# Patient Record
Sex: Female | Born: 1975 | Race: White | Hispanic: No | Marital: Married | Smoking: Never smoker
Health system: Southern US, Community
[De-identification: ages and names within clinical notes are randomized; demographics above are authoritative.]

## PROBLEM LIST (undated history)

## (undated) DIAGNOSIS — I10 Essential (primary) hypertension: Secondary | ICD-10-CM

## (undated) HISTORY — PX: APPENDECTOMY: SHX54

---

## 2015-04-11 ENCOUNTER — Emergency Department (HOSPITAL_COMMUNITY)
Admission: EM | Admit: 2015-04-11 | Discharge: 2015-04-11 | Disposition: A | Discharge status: Home / Self Care | Payer: Self-pay | Attending: Emergency Medicine | Admitting: Emergency Medicine

## 2015-04-11 ENCOUNTER — Encounter (HOSPITAL_COMMUNITY): Payer: Self-pay | Admitting: Family Medicine

## 2015-04-11 ENCOUNTER — Emergency Department (HOSPITAL_COMMUNITY): Payer: Self-pay

## 2015-04-11 DIAGNOSIS — S93402A Sprain of unspecified ligament of left ankle, initial encounter: Secondary | ICD-10-CM | POA: Insufficient documentation

## 2015-04-11 DIAGNOSIS — W1839XA Other fall on same level, initial encounter: Secondary | ICD-10-CM | POA: Insufficient documentation

## 2015-04-11 DIAGNOSIS — Y9389 Activity, other specified: Secondary | ICD-10-CM | POA: Insufficient documentation

## 2015-04-11 DIAGNOSIS — Y92219 Unspecified school as the place of occurrence of the external cause: Secondary | ICD-10-CM | POA: Insufficient documentation

## 2015-04-11 DIAGNOSIS — S8002XA Contusion of left knee, initial encounter: Secondary | ICD-10-CM | POA: Insufficient documentation

## 2015-04-11 DIAGNOSIS — Y990 Civilian activity done for income or pay: Secondary | ICD-10-CM | POA: Insufficient documentation

## 2015-04-11 MED ORDER — IBUPROFEN 800 MG PO TABS: 800.0000 mg | ORAL_TABLET | Freq: Three times a day (TID) | ORAL | Status: DC

## 2015-04-11 NOTE — ED Notes (Signed)
Pt here for left ankle pain and knee pain after fall. Light swelling in left ankle. Bruise to left knee.

## 2015-04-11 NOTE — ED Provider Notes (Signed)
CSN: [id number]     Arrival date & time 04/11/15  1058 History  This chart was scribed for non-physician practitioner, Langston Masker, PA-C, working with Blane Ohara, MD by Charline Bills, ED Scribe. This patient was seen in room TR07C/TR07C and the patient's care was started at 12:29 PM.   Chief Complaint  Patient presents with  . Ankle Pain  . Knee Pain   The history is provided by the patient. No language interpreter was used.   HPI Comments: Mary Mathews is a 39 y.o. female who presents to the Emergency Department complaining of persistent left knee pain for the past week. Pt states that she fell at work last week, landed on her left knee and twisted her left ankle upon falling. 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 She reports associated left ankle pain, left ankle swelling and gradually improving bruising to her left knee. Pt reports constant pain to her knee and ankle that are exacerbated with bearing weight. Pt states that she has had to use her cane to ambulate since the fall. No known medical allergies.   History reviewed. No pertinent past medical history. Past Surgical History  Procedure Laterality Date  . Appendectomy    . Cesarean section     History reviewed. No pertinent family history. History  Substance Use Topics  . Smoking status: Never Smoker   . Smokeless tobacco: Not on file  . Alcohol Use: Yes     Comment: occ   OB History    No data available     Review of Systems  Musculoskeletal: Positive for joint swelling and arthralgias.  Skin: Positive for color change.   Allergies  Review of patient's allergies indicates no known allergies.  Home Medications   Prior to Admission medications   Not on File   BP 147/75 mmHg  Pulse 67  Temp(Src) 98.6 F (37 C)  Resp 20  Ht  (1.727 m)  Wt 320 lb (145.151 kg)  BMI 48.67 kg/m2  SpO2 98%  LMP 03/28/2015 Physical Exam  Constitutional: She is oriented to person, place,  and time. She appears well-developed and well-nourished. No distress.  HENT:  Head: Normocephalic and atraumatic.  Eyes: Conjunctivae and EOM are normal.  Neck: Neck supple. No tracheal deviation present.  Cardiovascular: Normal rate.   Pulmonary/Chest: Effort normal. No respiratory distress.  Musculoskeletal: Normal range of motion.  L ankle: Swollen L ankle. Limited ROM of ankle. L knee: Bruised L knee. Good ROM of knee.  Neurological: She is alert and oriented to person, place, and time. No sensory deficit.  NVI  Skin: Skin is warm and dry.  Psychiatric: She has a normal mood and affect. Her behavior is normal.  Nursing note and vitals reviewed.  ED Course  Procedures (including critical care time) DIAGNOSTIC STUDIES: Oxygen Saturation is 98% on RA, normal by my interpretation.    COORDINATION OF CARE: 12:32 PM-Discussed treatment plan which includes XRs, 800 mg ibuprofen and crutches with pt at bedside and pt agreed to plan.   Labs Review Labs Reviewed - No data to display  Imaging Review Dg Ankle Complete Left  04/11/2015   CLINICAL DATA:  Slipped with twisting injury last week. Persistent ankle pain and swelling. Initial encounter.  EXAM: LEFT ANKLE COMPLETE - 3+ VIEW  COMPARISON:  None.  FINDINGS: The mineralization and alignment are normal. There is no evidence of acute fracture or dislocation. The soft tissue surrounding the ankle are prominent without  apparent focal swelling. There are mild talonavicular degenerative changes and a small plantar calcaneal spur.  IMPRESSION: No acute osseous findings.   Electronically Signed   By: Carey Bullocks M.D.   On: 04/11/2015 11:54   Dg Knee Complete 4 Views Left  04/11/2015   CLINICAL DATA:  Slipped and fell 1 week ago when Ing on the knee. Anterior knee pain with bruising.  EXAM: LEFT KNEE - COMPLETE 4+ VIEW  COMPARISON:  None.  FINDINGS: No evidence of fracture, dislocation or joint effusion. No joint space narrowing. No other focal  lesion.  IMPRESSION: Negative.   Electronically Signed   By: Paulina Fusi M.D.   On: 04/11/2015 11:55    EKG Interpretation None      MDM   Final diagnoses:  Ankle sprain, left, initial encounter  Contusion, knee, left, initial encounter   Aso/cruthes Ibuprofen   I personally performed the services in this documentation, which was scribed in my presence.  The recorded information has been reviewed and considered.   Barnet Pall.   Lonia Skinner Onarga, PA-C 04/11/15 1638  Blane Ohara, MD 04/11/15 [phone]

## 2015-04-11 NOTE — Discharge Instructions (Signed)
Ankle Sprain  An ankle sprain is an injury to the strong, fibrous tissues (ligaments) that hold the bones of your ankle joint together.   CAUSES  An ankle sprain is usually caused by a fall or by twisting your ankle. Ankle sprains most commonly occur when you step on the outer edge of your foot, and your ankle turns inward. People who participate in sports are more prone to these types of injuries.   SYMPTOMS    Pain in your ankle. The pain may be present at rest or only when you are trying to stand or walk.   Swelling.   Bruising. Bruising may develop immediately or within 1 to 2 days after your injury.   Difficulty standing or walking, particularly when turning corners or changing directions.  DIAGNOSIS   Your caregiver will ask you details about your injury and perform a physical exam of your ankle to determine if you have an ankle sprain. During the physical exam, your caregiver will press on and apply pressure to specific areas of your foot and ankle. Your caregiver will try to move your ankle in certain ways. An X-ray exam may be done to be sure a bone was not broken or a ligament did not separate from one of the bones in your ankle (avulsion fracture).   TREATMENT   Certain types of braces can help stabilize your ankle. Your caregiver can make a recommendation for this. Your caregiver may recommend the use of medicine for pain. If your sprain is severe, your caregiver may refer you to a surgeon who helps to restore function to parts of your skeletal system (orthopedist) or a physical therapist.  HOME CARE INSTRUCTIONS    Apply ice to your injury for 1-2 days or as directed by your caregiver. Applying ice helps to reduce inflammation and pain.   Put ice in a plastic bag.   Place a towel between your skin and the bag.   Leave the ice on for 15-20 minutes at a time, every 2 hours while you are awake.   Only take over-the-counter or prescription medicines for pain, discomfort, or fever as directed by  your caregiver.   Elevate your injured ankle above the level of your heart as much as possible for 2-3 days.   If your caregiver recommends crutches, use them as instructed. Gradually put weight on the affected ankle. Continue to use crutches or a cane until you can walk without feeling pain in your ankle.   If you have a plaster splint, wear the splint as directed by your caregiver. Do not rest it on anything harder than a pillow for the first 24 hours. Do not put weight on it. Do not get it wet. You may take it off to take a shower or bath.   You may have been given an elastic bandage to wear around your ankle to provide support. If the elastic bandage is too tight (you have numbness or tingling in your foot or your foot becomes cold and blue), adjust the bandage to make it comfortable.   If you have an air splint, you may blow more air into it or let air out to make it more comfortable. You may take your splint off at night and before taking a shower or bath. Wiggle your toes in the splint several times per day to decrease swelling.  SEEK MEDICAL CARE IF:    You have rapidly increasing bruising or swelling.   Your toes feel extremely cold or   you lose feeling in your foot.   Your pain is not relieved with medicine.  SEEK IMMEDIATE MEDICAL CARE IF:   Your toes are numb or blue.   You have severe pain that is increasing.  MAKE SURE YOU:    Understand these instructions.   Will watch your condition.   Will get help right away if you are not doing well or get worse.  Document Released: 08/25/2005 Document Revised: 05/19/2012 Document Reviewed: 09/06/2011  [employer] Patient Information 2015 [employer]. This information is not intended to replace advice given to you by your health care provider. Make sure you discuss any questions you have with your health care provider.  Contusion  A contusion is a deep bruise. Contusions are the result of an injury that caused bleeding under the skin. The contusion  may turn blue, purple, or yellow. Minor injuries will give you a painless contusion, but more severe contusions may stay painful and swollen for a few weeks.   CAUSES   A contusion is usually caused by a blow, trauma, or direct force to an area of the body.  SYMPTOMS    Swelling and redness of the injured area.   Bruising of the injured area.   Tenderness and soreness of the injured area.   Pain.  DIAGNOSIS   The diagnosis can be made by taking a history and physical exam. An X-ray, CT scan, or MRI may be needed to determine if there were any associated injuries, such as fractures.  TREATMENT   Specific treatment will depend on what area of the body was injured. In general, the best treatment for a contusion is resting, icing, elevating, and applying cold compresses to the injured area. Over-the-counter medicines may also be recommended for pain control. Ask your caregiver what the best treatment is for your contusion.  HOME CARE INSTRUCTIONS    Put ice on the injured area.   Put ice in a plastic bag.   Place a towel between your skin and the bag.   Leave the ice on for 15-20 minutes, 3-4 times a day, or as directed by your health care provider.   Only take over-the-counter or prescription medicines for pain, discomfort, or fever as directed by your caregiver. Your caregiver may recommend avoiding anti-inflammatory medicines (aspirin, ibuprofen, and naproxen) for 48 hours because these medicines may increase bruising.   Rest the injured area.   If possible, elevate the injured area to reduce swelling.  SEEK IMMEDIATE MEDICAL CARE IF:    You have increased bruising or swelling.   You have pain that is getting worse.   Your swelling or pain is not relieved with medicines.  MAKE SURE YOU:    Understand these instructions.   Will watch your condition.   Will get help right away if you are not doing well or get worse.  Document Released: 06/04/2005 Document Revised: 08/30/2013 Document Reviewed:  06/30/2011  [employer] Patient Information 2015 [employer]. This information is not intended to replace advice given to you by your health care provider. Make sure you discuss any questions you have with your health care provider.

## 2019-10-13 ENCOUNTER — Encounter (HOSPITAL_COMMUNITY): Payer: Self-pay

## 2019-10-13 ENCOUNTER — Ambulatory Visit (HOSPITAL_COMMUNITY)
Admission: EM | Admit: 2019-10-13 | Discharge: 2019-10-13 | Disposition: A | Discharge status: Home / Self Care | Payer: Self-pay | Attending: Family Medicine | Admitting: Family Medicine

## 2019-10-13 ENCOUNTER — Other Ambulatory Visit: Payer: Self-pay

## 2019-10-13 DIAGNOSIS — G8929 Other chronic pain: Secondary | ICD-10-CM

## 2019-10-13 DIAGNOSIS — M79673 Pain in unspecified foot: Secondary | ICD-10-CM

## 2019-10-13 MED ORDER — MELOXICAM 7.5 MG PO TABS: 15.0000 mg | ORAL_TABLET | Freq: Every day | ORAL | 0 refills | Status: DC

## 2019-10-13 NOTE — ED Triage Notes (Signed)
Pt is here with foot pain that started in December, states its in her left ankle and both heels ever since she started back working.

## 2019-10-13 NOTE — Discharge Instructions (Signed)
Take the meloxicam daily for pain as needed You can also take additional Tylenol with this medication if needed.  Do not take any other ibuprofen or Aleve while taking the meloxicam. Follow-up with [facility] as planned

## 2019-10-14 NOTE — ED Provider Notes (Signed)
[facility]    CSN: [id number] Arrival date & time: 10/13/19  1139      History   Chief Complaint Chief Complaint  Patient presents with  . Foot Pain    HPI Mary Mathews is a 44 y.o. female.   Patient is a 44 year old female that presents today with bilateral foot pain, ankle pain.  This is been ongoing chronic thing for her for the past couple years.  Started getting worse around December.  Has had x-rays in the past and known osteoarthritis.  No new injuries.  She has been taking ibuprofen without much with the pain.  She has plans to see the tried foot specialist.  No increased swelling, redness, numbness or tingling. No open wounds, sores. Has been wearing orthopedic shoes which help from time to time. She stands long hours working at [employer] on hard floor.   ROS per HPI      History reviewed. No pertinent past medical history.  There are no problems to display for this patient.   Past Surgical History:  Procedure Laterality Date  . APPENDECTOMY    . CESAREAN SECTION      OB History   No obstetric history on file.      Home Medications    Prior to Admission medications   Medication Sig Start Date End Date Taking? Authorizing Provider  ibuprofen (ADVIL,MOTRIN) 800 MG tablet Take 1 tablet (800 mg total) by mouth 3 (three) times daily. 04/11/15   Elson Areas, PA-C  meloxicam (MOBIC) 7.5 MG tablet Take 2 tablets (15 mg total) by mouth daily. 10/13/19   Janace Aris, NP    Family History Family History  Problem Relation Age of Onset  . Breast cancer Mother   . Hypertension Mother   . Congestive Heart Failure Father   . Hypertension Father     Social History Social History   Tobacco Use  . Smoking status: Never Smoker  . Smokeless tobacco: Never Used  Substance Use Topics  . Alcohol use: Yes    Comment: occ  . Drug use: No     Allergies   Patient has no known allergies.   Review of Systems Review of Systems   Physical  Exam Triage Vital Signs ED Triage Vitals  Enc Vitals Group     BP 10/13/19 1214 (!) 152/80     Pulse Rate 10/13/19 1214 69     Resp 10/13/19 1214 (!) 22     Temp 10/13/19 1214 98.3 F (36.8 C)     Temp Source 10/13/19 1214 Oral     SpO2 10/13/19 1214 100 %     Weight 10/13/19 1212 (!) 332 lb (150.6 kg)     Height --      Head Circumference --      Peak Flow --      Pain Score 10/13/19 1211 8     Pain Loc --      Pain Edu? --      Excl. in GC? --    No data found.  Updated Vital Signs BP (!) 152/80 (BP Location: Left Arm)   Pulse 69   Temp 98.3 F (36.8 C) (Oral)   Resp (!) 22   Wt (!) 332 lb (150.6 kg)   LMP 09/19/2019   SpO2 100%   BMI 50.48 kg/m   Visual Acuity Right Eye Distance:   Left Eye Distance:   Bilateral Distance:    Right Eye Near:   Left Eye  Near:    Bilateral Near:     Physical Exam Vitals and nursing note reviewed.  Constitutional:      General: She is not in acute distress.    Appearance: Normal appearance. She is not ill-appearing, toxic-appearing or diaphoretic.  HENT:     Head: Normocephalic.     Nose: Nose normal.  Eyes:     Conjunctiva/sclera: Conjunctivae normal.  Pulmonary:     Effort: Pulmonary effort is normal.  Musculoskeletal:        General: Normal range of motion.     Cervical back: Normal range of motion.  Skin:    General: Skin is warm and dry.     Findings: No rash.  Neurological:     Mental Status: She is alert.  Psychiatric:        Mood and Affect: Mood normal.      UC Treatments / Results  Labs (all labs ordered are listed, but only abnormal results are displayed) Labs Reviewed - No data to display  EKG   Radiology No results found.  Procedures Procedures (including critical care time)  Medications Ordered in UC Medications - No data to display  Initial Impression / Assessment and Plan / UC Course  I have reviewed the triage vital signs and the nursing notes.  Pertinent labs & imaging results  that were available during my care of the patient were reviewed by me and considered in my medical decision making (see chart for details).     Chronic foot pain- pt here today requesting note for work so she can take a 30 minute break. She plans to see a specialist soon.  Given meloxicam for pain to take daily.  Tylenol for additional pain relief.  Rest, Ice orthopedic shoes.   Final Clinical Impressions(s) / UC Diagnoses   Final diagnoses:  Chronic foot pain, unspecified laterality     Discharge Instructions     Take the meloxicam daily for pain as needed You can also take additional Tylenol with this medication if needed.  Do not take any other ibuprofen or Aleve while taking the meloxicam. Follow-up with [facility] as planned    ED Prescriptions    Medication Sig Dispense Auth. Provider   meloxicam (MOBIC) 7.5 MG tablet Take 2 tablets (15 mg total) by mouth daily. 30 tablet Loura Halt A, NP     PDMP not reviewed this encounter.   Loura Halt A, NP 10/14/19 1204

## 2019-11-02 ENCOUNTER — Ambulatory Visit (HOSPITAL_COMMUNITY)
Admission: EM | Admit: 2019-11-02 | Discharge: 2019-11-02 | Disposition: A | Discharge status: Home / Self Care | Payer: Self-pay | Attending: Family Medicine | Admitting: Family Medicine

## 2019-11-02 ENCOUNTER — Other Ambulatory Visit: Payer: Self-pay

## 2019-11-02 ENCOUNTER — Encounter (HOSPITAL_COMMUNITY): Payer: Self-pay

## 2019-11-02 DIAGNOSIS — M79672 Pain in left foot: Secondary | ICD-10-CM

## 2019-11-02 DIAGNOSIS — M25572 Pain in left ankle and joints of left foot: Secondary | ICD-10-CM

## 2019-11-02 DIAGNOSIS — M25571 Pain in right ankle and joints of right foot: Secondary | ICD-10-CM

## 2019-11-02 DIAGNOSIS — M79671 Pain in right foot: Secondary | ICD-10-CM

## 2019-11-02 DIAGNOSIS — I10 Essential (primary) hypertension: Secondary | ICD-10-CM

## 2019-11-02 MED ORDER — MELOXICAM 7.5 MG PO TABS: 7.5000 mg | ORAL_TABLET | Freq: Every day | ORAL | 0 refills | Status: DC

## 2019-11-02 MED ORDER — AMLODIPINE BESYLATE 5 MG PO TABS: 5.0000 mg | ORAL_TABLET | Freq: Every day | ORAL | 0 refills | Status: DC

## 2019-11-02 NOTE — ED Provider Notes (Signed)
[facility]    CSN: [id number] Arrival date & time: 11/02/19  7619      History   Chief Complaint Chief Complaint  Patient presents with  . Ankle Pain  . Foot Pain    HPI Mary Mathews is a 44 y.o. female no significant past medical history presenting today for evaluation of bilateral foot and ankle pain and medication refill.  Patient states that she has chronic pain in bilateral heels and ankle areas.  She has known arthritis from prior x-rays.  Pain has been going on for years.  Recently she has been treated with meloxicam which does provide temporary relief for approximately 3 to 4 hours.  She has previously seen foot specialist, not done recently.  Does not have primary care.  Wears orthopedic shoes and compression stockings.  Pain similar to typical symptoms.  Denies erythema/fevers.  She also is requesting treatment of blood pressure.  Previously was on blood pressure medicine, has not been up currently.  Cannot recall prior medication.  Notes that blood pressure typically elevated recently.  Denies chest pain or headaches.  HPI  History reviewed. No pertinent past medical history.  There are no problems to display for this patient.   Past Surgical History:  Procedure Laterality Date  . APPENDECTOMY    . CESAREAN SECTION      OB History   No obstetric history on file.      Home Medications    Prior to Admission medications   Medication Sig Start Date End Date Taking? Authorizing Provider  amLODipine (NORVASC) 5 MG tablet Take 1 tablet (5 mg total) by mouth daily. 11/02/19   Lester Crickenberger C, PA-C  meloxicam (MOBIC) 7.5 MG tablet Take 1 tablet (7.5 mg total) by mouth daily. Take in the morning, with food. 11/02/19   Kamica Florance, Junius Creamer, PA-C    Family History Family History  Problem Relation Age of Onset  . Breast cancer Mother   . Hypertension Mother   . Congestive Heart Failure Father   . Hypertension Father     Social History Social  History   Tobacco Use  . Smoking status: Never Smoker  . Smokeless tobacco: Never Used  Substance Use Topics  . Alcohol use: Yes    Comment: occ  . Drug use: No     Allergies   Patient has no known allergies.   Review of Systems Review of Systems  Constitutional: Negative for fatigue and fever.  Eyes: Negative for visual disturbance.  Respiratory: Negative for shortness of breath.   Cardiovascular: Negative for chest pain.  Gastrointestinal: Negative for abdominal pain, nausea and vomiting.  Musculoskeletal: Positive for arthralgias and gait problem. Negative for joint swelling.  Skin: Negative for color change, rash and wound.  Neurological: Negative for dizziness, weakness, light-headedness and headaches.     Physical Exam Triage Vital Signs ED Triage Vitals  Enc Vitals Group     BP 11/02/19 0857 (!) 154/91     Pulse Rate 11/02/19 0857 67     Resp 11/02/19 0857 20     Temp 11/02/19 0857 98.9 F (37.2 C)     Temp Source 11/02/19 0857 Oral     SpO2 11/02/19 0857 97 %     Weight 11/02/19 0853 (!) 326 lb 9.6 oz (148.1 kg)     Height --      Head Circumference --      Peak Flow --      Pain Score 11/02/19 0853 6  Pain Loc --      Pain Edu? --      Excl. in GC? --    No data found.  Updated Vital Signs BP (!) 154/91 (BP Location: Left Arm)   Pulse 67   Temp 98.9 F (37.2 C) (Oral)   Resp 20   Wt (!) 326 lb 9.6 oz (148.1 kg)   LMP 10/21/2019   SpO2 97%   BMI 49.66 kg/m   Visual Acuity Right Eye Distance:   Left Eye Distance:   Bilateral Distance:    Right Eye Near:   Left Eye Near:    Bilateral Near:     Physical Exam Vitals and nursing note reviewed.  Constitutional:      Appearance: She is well-developed.     Comments: No acute distress  HENT:     Head: Normocephalic and atraumatic.     Nose: Nose normal.  Eyes:     Conjunctiva/sclera: Conjunctivae normal.  Cardiovascular:     Rate and Rhythm: Normal rate.  Pulmonary:     Effort:  Pulmonary effort is normal. No respiratory distress.  Abdominal:     General: There is no distension.  Musculoskeletal:        General: Normal range of motion.     Cervical back: Neck supple.     Comments: Bilateral ankles/feet: No obvious swelling erythema or deformity, no discoloration, bilateral dorsalis pedis 2+, mild tenderness to palpation to anterior ankles bilaterally and over plantar heel areas  Skin:    General: Skin is warm and dry.  Neurological:     Mental Status: She is alert and oriented to person, place, and time.      UC Treatments / Results  Labs (all labs ordered are listed, but only abnormal results are displayed) Labs Reviewed - No data to display  EKG   Radiology No results found.  Procedures Procedures (including critical care time)  Medications Ordered in UC Medications - No data to display  Initial Impression / Assessment and Plan / UC Course  I have reviewed the triage vital signs and the nursing notes.  Pertinent labs & imaging results that were available during my care of the patient were reviewed by me and considered in my medical decision making (see chart for details).    1.  Bilateral ankle and heel pain, chronic Refilling Mobic, no new complaints with feet/ankles.  No obvious deformity, no sign of cellulitis or infection.  2.  Hypertension Initiating on amlodipine 5 mg daily, blood pressure has been consistently elevated over past visits.  Will have follow-up and establish care with primary care for further management and treatment.  Discussed strict return precautions. Patient verbalized understanding and is agreeable with plan.  Final Clinical Impressions(s) / UC Diagnoses   Final diagnoses:  Bilateral foot pain  Acute bilateral ankle pain  Essential hypertension     Discharge Instructions     I refilled your Mobic to help with your foot and ankle pain, take with food  Please begin amlodipine 5 mg daily to treat blood  pressure Please follow-up with primary care for further blood pressure treatment, refills and management Please monitor blood pressure at home if you are able   ED Prescriptions    Medication Sig Dispense Auth. Provider   meloxicam (MOBIC) 7.5 MG tablet Take 1 tablet (7.5 mg total) by mouth daily. Take in the morning, with food. 30 tablet Reynalda Canny C, PA-C   amLODipine (NORVASC) 5 MG tablet Take 1 tablet (5  mg total) by mouth daily. 30 tablet Watson Robarge, Mansfield C, PA-C     PDMP not reviewed this encounter.   Janith Lima, PA-C 11/02/19 1009

## 2019-11-02 NOTE — ED Triage Notes (Addendum)
Pt is here for a follow up for her last visit on 10/13/2019 & states her pain in both heels have went down a lot. States she has no PCP.

## 2019-11-02 NOTE — Discharge Instructions (Signed)
I refilled your Mobic to help with your foot and ankle pain, take with food  Please begin amlodipine 5 mg daily to treat blood pressure Please follow-up with primary care for further blood pressure treatment, refills and management Please monitor blood pressure at home if you are able

## 2019-11-09 ENCOUNTER — Emergency Department (HOSPITAL_COMMUNITY): Payer: Self-pay

## 2019-11-09 ENCOUNTER — Encounter (HOSPITAL_COMMUNITY): Payer: Self-pay

## 2019-11-09 ENCOUNTER — Other Ambulatory Visit: Payer: Self-pay

## 2019-11-09 ENCOUNTER — Emergency Department (HOSPITAL_COMMUNITY)
Admission: EM | Admit: 2019-11-09 | Discharge: 2019-11-10 | Disposition: A | Discharge status: Home / Self Care | Payer: Self-pay | Attending: Emergency Medicine | Admitting: Emergency Medicine

## 2019-11-09 DIAGNOSIS — I10 Essential (primary) hypertension: Secondary | ICD-10-CM | POA: Insufficient documentation

## 2019-11-09 DIAGNOSIS — Z79899 Other long term (current) drug therapy: Secondary | ICD-10-CM | POA: Insufficient documentation

## 2019-11-09 DIAGNOSIS — R072 Precordial pain: Secondary | ICD-10-CM | POA: Insufficient documentation

## 2019-11-09 LAB — CBC
HCT: 38.5 % (ref 36.0–46.0)
Hemoglobin: 12.3 g/dL (ref 12.0–15.0)
MCH: 28.3 pg (ref 26.0–34.0)
MCHC: 31.9 g/dL (ref 30.0–36.0)
MCV: 88.5 fL (ref 80.0–100.0)
Platelets: 232 10*3/uL (ref 150–400)
RBC: 4.35 MIL/uL (ref 3.87–5.11)
RDW: 14 % (ref 11.5–15.5)
WBC: 8.4 10*3/uL (ref 4.0–10.5)
nRBC: 0 % (ref 0.0–0.2)

## 2019-11-09 LAB — I-STAT BETA HCG BLOOD, ED (MC, WL, AP ONLY): I-stat hCG, quantitative: <5 m[IU]/mL (ref <5)

## 2019-11-09 LAB — BASIC METABOLIC PANEL
Anion gap: 11 (ref 5–15)
BUN: 11 mg/dL (ref 6–20)
CO2: 22 mmol/L (ref 22–32)
Calcium: 8.8 mg/dL — ABNORMAL LOW (ref 8.9–10.3)
Chloride: 106 mmol/L (ref 98–111)
Creatinine, Ser: 0.75 mg/dL (ref 0.44–1.00)
GFR calc Af Amer: >60 mL/min (ref >60)
GFR calc non Af Amer: >60 mL/min (ref >60)
Glucose, Bld: 98 mg/dL (ref 70–99)
Potassium: 3.9 mmol/L (ref 3.5–5.1)
Sodium: 139 mmol/L (ref 135–145)

## 2019-11-09 LAB — TROPONIN I (HIGH SENSITIVITY)
Troponin I (High Sensitivity): 4 ng/L (ref <18)
Troponin I (High Sensitivity): 3 ng/L (ref <18)

## 2019-11-09 NOTE — ED Triage Notes (Signed)
Pt reports chest pain since Monday that has worsened over the past few days, denies any other symptoms, pain is worse when she bends over. Pt a.o, arrives with walker.

## 2019-11-10 ENCOUNTER — Telehealth: Payer: Self-pay | Admitting: *Deleted

## 2019-11-10 MED ORDER — ACETAMINOPHEN 325 MG PO TABS
650.0000 mg | ORAL_TABLET | Freq: Once | ORAL | Status: AC
Start: 2019-11-10 — End: 2019-11-10
  Administered 2019-11-10: 650 mg via ORAL
  Filled 2019-11-10: qty 2

## 2019-11-10 NOTE — Discharge Instructions (Addendum)

## 2019-11-10 NOTE — ED Provider Notes (Signed)
[facility] EMERGENCY DEPARTMENT Provider Note   CSN: [id number] Arrival date & time: 11/09/19  1718     History Chief Complaint  Patient presents with  . Chest Pain    Gayleen Sholtz is a 44 y.o. female.   Chest Pain Associated symptoms: no abdominal pain, no fever, no nausea, no shortness of breath and no vomiting     HPI: A 44 year old patient with a history of obesity presents for evaluation of chest pain. Initial onset of pain was more than 6 hours ago. The patient's chest pain is not worse with exertion. The patient's chest pain is not middle- or left-sided, is not well-localized, is not described as heaviness/pressure/tightness, is not sharp and does not radiate to the arms/jaw/neck. The patient does not complain of nausea and denies diaphoresis. The patient has a family history of coronary artery disease in a first-degree relative with onset less than age 55. The patient has no history of stroke, has no history of peripheral artery disease, has not smoked in the past 90 days, denies any history of treated diabetes, is not hypertensive and has no history of hypercholesterolemia.  Patient reports right-sided chest pain for up to 2 days.  She reports it is only worse with bending over and palpation. She denies pleuritic pain She denies any other symptoms.  She denies fever/vomiting/shortness of breath. She denies any known history of CAD/VTE She uses a walker chronically due to chronic bilateral foot pain. No other acute complaints She recently restarted blood pressure medications.  PMH-HTN, plantar fascitis Past Surgical History:  Procedure Laterality Date  . APPENDECTOMY    . CESAREAN SECTION       OB History   No obstetric history on file.     Family History  Problem Relation Age of Onset  . Breast cancer Mother   . Hypertension Mother   . Congestive Heart Failure Father   . Hypertension Father     Social History   Tobacco Use  .  Smoking status: Never Smoker  . Smokeless tobacco: Never Used  Substance Use Topics  . Alcohol use: Yes    Comment: occ  . Drug use: No    Home Medications Prior to Admission medications   Medication Sig Start Date End Date Taking? Authorizing Provider  amLODipine (NORVASC) 5 MG tablet Take 1 tablet (5 mg total) by mouth daily. 11/02/19   Wieters, Hallie C, PA-C  meloxicam (MOBIC) 7.5 MG tablet Take 1 tablet (7.5 mg total) by mouth daily. Take in the morning, with food. 11/02/19   Wieters, Hallie C, PA-C    Allergies    Patient has no known allergies.  Review of Systems   Review of Systems  Constitutional: Negative for fever.  Respiratory: Negative for shortness of breath.   Cardiovascular: Positive for chest pain.       Chronic leg swelling  Gastrointestinal: Negative for abdominal pain, nausea and vomiting.  Musculoskeletal: Positive for arthralgias.       Chronic foot pain  All other systems reviewed and are negative.   Physical Exam Updated Vital Signs BP (!) 164/84 (BP Location: Right Arm)   Pulse 70   Temp 98.2 F (36.8 C) (Oral)   Resp (!) 24   LMP 10/21/2019   SpO2 99%   Physical Exam CONSTITUTIONAL: Well developed/well nourished HEAD: Normocephalic/atraumatic EYES: EOMI ENMT: Mucous membranes moist NECK: supple no meningeal signs SPINE/BACK:entire spine nontender CV: S1/S2 noted, no murmurs/rubs/gallops noted LUNGS: Lungs are clear to auscultation bilaterally, no  apparent distress Chest-mild right-sided tenderness, no crepitus ABDOMEN: soft, nontender, no rebound or guarding, bowel sounds noted throughout abdomen GU:no cva tenderness NEURO: Pt is awake/alert/appropriate, moves all extremitiesx4.  No facial droop.   EXTREMITIES: pulses normal/equal, full ROM SKIN: warm, color normal PSYCH: no abnormalities of mood noted, alert and oriented to situation  ED Results / Procedures / Treatments   Labs (all labs ordered are listed, but only abnormal results  are displayed) Labs Reviewed  BASIC METABOLIC PANEL - Abnormal; Notable for the following components:      Result Value   Calcium 8.8 (*)    All other components within normal limits  CBC  I-STAT BETA HCG BLOOD, ED ([facility], [facility], [facility] ONLY)  TROPONIN I (HIGH SENSITIVITY)  TROPONIN I (HIGH SENSITIVITY)    EKG EKG Interpretation  Date/Time:  Wednesday November 09 2019 17:19:40 EST Ventricular Rate:  85 PR Interval:  146 QRS Duration: 76 QT Interval:  364 QTC Calculation: 433 R Axis:   3 Text Interpretation: Normal sinus rhythm Anterior infarct , age undetermined Abnormal ECG No previous ECGs available Interpretation limited secondary to artifact Confirmed by Ripley Fraise [id number]) on 11/10/2019 2:22:17 AM   Radiology DG Chest 2 View  Result Date: 11/09/2019 CLINICAL DATA:  Chest pain. EXAM: CHEST - 2 VIEW COMPARISON:  None. FINDINGS: The heart size and mediastinal contours are within normal limits. Both lungs are clear. The visualized skeletal structures are unremarkable. IMPRESSION: No active cardiopulmonary disease. Electronically Signed   By: Constance Holster M.D.   On: 11/09/2019 17:55    Procedures Procedures  Medications Ordered in ED Medications  acetaminophen (TYLENOL) tablet 650 mg (has no administration in time range)    ED Course  I have reviewed the triage vital signs and the nursing notes.  Pertinent labs & imaging results that were available during my care of the patient were reviewed by me and considered in my medical decision making (see chart for details).    MDM Rules/Calculators/A&P HEAR Score: 2                    Patient presents for right-sided chest pain.  It is worse with bending over and palpation Low suspicion for ACS/PE.  She had 1 documented episode with a pulse ox 94% @ 2305 but readings since that time have been normal  This all appears to be musculoskeletal in origin.  Patient does not have a local primary care, and it is her third ER/urgent care  visit in 1 month Referral has been given to [facility] health and wellness for blood pressure management.  Final Clinical Impression(s) / ED Diagnoses Final diagnoses:  Precordial pain    Rx / DC Orders ED Discharge Orders    None       Ripley Fraise, MD 11/10/19 [phone]

## 2019-11-10 NOTE — ED Notes (Signed)
Patient ambulated to room from waiting room without difficulty.  Used home walker.  NAD

## 2019-11-10 NOTE — Telephone Encounter (Signed)
RNCM attempted to call pt to inform her of upcoming appointment 3/22 @2 :30 at [facility].  Will reach out to Urgent Care to have them inform her should she return there before 3/22.

## 2019-11-28 ENCOUNTER — Other Ambulatory Visit: Payer: Self-pay

## 2019-11-28 ENCOUNTER — Ambulatory Visit: Payer: Self-pay | Attending: Family Medicine | Admitting: Family Medicine

## 2019-11-28 ENCOUNTER — Encounter: Payer: Self-pay | Admitting: Family Medicine

## 2019-11-28 VITALS — Ht 68.5 in

## 2019-11-28 DIAGNOSIS — M79672 Pain in left foot: Secondary | ICD-10-CM

## 2019-11-28 DIAGNOSIS — G8929 Other chronic pain: Secondary | ICD-10-CM

## 2019-11-28 DIAGNOSIS — I10 Essential (primary) hypertension: Secondary | ICD-10-CM

## 2019-11-28 DIAGNOSIS — M79671 Pain in right foot: Secondary | ICD-10-CM

## 2019-11-28 MED ORDER — AMLODIPINE BESYLATE 5 MG PO TABS: 5.0000 mg | ORAL_TABLET | Freq: Every day | ORAL | 3 refills | Status: DC

## 2019-11-28 MED ORDER — MELOXICAM 7.5 MG PO TABS: 7.5000 mg | ORAL_TABLET | Freq: Every day | ORAL | 3 refills | Status: AC

## 2019-11-28 NOTE — Progress Notes (Signed)
Virtual Visit via Telephone Note  I connected with Mary Mathews, on 11/28/2019 at 3:00 PM by telephone due to the COVID-19 pandemic and verified that I am speaking with the correct person using two identifiers.   Consent: I discussed the limitations, risks, security and privacy concerns of performing an evaluation and management service by telephone and the availability of in person appointments. I also discussed with the patient that there may be a patient responsible charge related to this service. The patient expressed understanding and agreed to proceed.   Location of Patient: Home  Location of Provider: Clinic   Persons participating in Telemedicine visit: Draya Felker Farrington-CMA Dr. Alvis Lemmings     History of Present Illness: Mary Mathews is a 44 year old female with a history of hypertension who presents today to establish care. She was seen 3 weeks ago at the ED for chest pain and ruled out for ACS, EKG, chest x-ray and labs were normal.  Pain was thought to be musculoskeletal in origin. She is doing a whole lot better and chest pain has resolved She takes Meloxicam as needed for feet pain and ankles; L ankle is worse.  She attributes feet pain to having flatfeet and spurs.  No past medical history on file. No Known Allergies  Current Outpatient Medications on File Prior to Visit  Medication Sig Dispense Refill  . acetaminophen (TYLENOL) 325 MG tablet Take 650 mg by mouth every 6 (six) hours as needed for mild pain or fever.    Marland Kitchen amLODipine (NORVASC) 5 MG tablet Take 1 tablet (5 mg total) by mouth daily. 30 tablet 0  . meloxicam (MOBIC) 7.5 MG tablet Take 1 tablet (7.5 mg total) by mouth daily. Take in the morning, with food. 30 tablet 0   No current facility-administered medications on file prior to visit.    Observations/Objective: Awake, alert, oriented x3 Not in acute distress  Assessment and Plan: 1. Essential hypertension Uncontrolled  from last ED visit with blood pressure 164/84 We will recheck blood pressure next in-person visit Continue amlodipine Counseled on blood pressure goal of less than 130/80, low-sodium, DASH diet, medication compliance, 150 minutes of moderate intensity exercise per week. Discussed medication compliance, adverse effects. - amLODipine (NORVASC) 5 MG tablet; Take 1 tablet (5 mg total) by mouth daily.  Dispense: 30 tablet; Refill: 3  2. Chronic pain of both feet Stable - meloxicam (MOBIC) 7.5 MG tablet; Take 1 tablet (7.5 mg total) by mouth daily. Take in the morning, with food.  Dispense: 30 tablet; Refill: 3   Follow Up Instructions: Return in about 1 month (around 12/29/2019) for Annual physical exam and fasting labs.    I discussed the assessment and treatment plan with the patient. The patient was provided an opportunity to ask questions and all were answered. The patient agreed with the plan and demonstrated an understanding of the instructions.   The patient was advised to call back or seek an in-person evaluation if the symptoms worsen or if the condition fails to improve as anticipated.     I provided 11 minutes total of non-face-to-face time during this encounter including median intraservice time, reviewing previous notes, investigations, ordering medications, medical decision making, coordinating care and patient verbalized understanding at the end of the visit.     Hoy Register, MD, FAAFP. [facility] [place], [place] [phone]   11/28/2019, 3:00 PM

## 2019-12-14 ENCOUNTER — Encounter (HOSPITAL_COMMUNITY): Payer: Self-pay

## 2019-12-14 ENCOUNTER — Ambulatory Visit (HOSPITAL_COMMUNITY)
Admission: EM | Admit: 2019-12-14 | Discharge: 2019-12-14 | Disposition: A | Discharge status: Home / Self Care | Payer: Self-pay

## 2019-12-14 ENCOUNTER — Other Ambulatory Visit: Payer: Self-pay

## 2019-12-14 ENCOUNTER — Ambulatory Visit (INDEPENDENT_AMBULATORY_CARE_PROVIDER_SITE_OTHER): Payer: Self-pay

## 2019-12-14 DIAGNOSIS — X58XXXA Exposure to other specified factors, initial encounter: Secondary | ICD-10-CM

## 2019-12-14 DIAGNOSIS — M7989 Other specified soft tissue disorders: Secondary | ICD-10-CM

## 2019-12-14 DIAGNOSIS — S61207A Unspecified open wound of left little finger without damage to nail, initial encounter: Secondary | ICD-10-CM

## 2019-12-14 DIAGNOSIS — M79645 Pain in left finger(s): Secondary | ICD-10-CM

## 2019-12-14 HISTORY — DX: Essential (primary) hypertension: I10

## 2019-12-14 MED ORDER — CEFTRIAXONE SODIUM 1 G IJ SOLR
INTRAMUSCULAR | Status: AC
  Filled 2019-12-14: qty 10

## 2019-12-14 MED ORDER — CEFTRIAXONE SODIUM 1 G IJ SOLR
1.0000 g | Freq: Once | INTRAMUSCULAR | Status: AC
  Administered 2019-12-14: 1 g via INTRAMUSCULAR

## 2019-12-14 MED ORDER — KETOROLAC TROMETHAMINE 30 MG/ML IJ SOLN
INTRAMUSCULAR | Status: AC
  Filled 2019-12-14: qty 1

## 2019-12-14 MED ORDER — LIDOCAINE HCL (PF) 1 % IJ SOLN
INTRAMUSCULAR | Status: AC
  Filled 2019-12-14: qty 2

## 2019-12-14 MED ORDER — TRAMADOL HCL 50 MG PO TABS
50.0000 mg | ORAL_TABLET | Freq: Four times a day (QID) | ORAL | 0 refills | Status: AC | PRN
Start: 2019-12-14 — End: ?

## 2019-12-14 MED ORDER — AMOXICILLIN-POT CLAVULANATE 875-125 MG PO TABS: 1.0000 | ORAL_TABLET | Freq: Two times a day (BID) | ORAL | 0 refills | Status: AC

## 2019-12-14 MED ORDER — KETOROLAC TROMETHAMINE 30 MG/ML IJ SOLN
30.0000 mg | Freq: Once | INTRAMUSCULAR | Status: AC
  Administered 2019-12-14: 13:00:00 30 mg via INTRAVENOUS

## 2019-12-14 NOTE — ED Provider Notes (Signed)
[facility]    CSN: [id number] Arrival date & time: 12/14/19  1054      History   Chief Complaint Chief Complaint  Patient presents with  . abcess    HPI Mary Mathews is a 44 y.o. female.   Patient is a 44 year old female with past medical history of hypertension.  She is here today for left hand, swelling, erythema and drainage.  Reporting she hit her hand on something at work last week and it left a small bump.  Since the bump has grown and is started draining.  Bloody, purulent drainage.  Very limited range of motion of the hand and digits due to the swelling.  2+ edema to the fingers and hand is warm to touch.  Good strong radial pulse.  No history of diabetes.  No fever, chills, body aches or night sweats.  She has been taking Tylenol for the pain with no relief.  ROS per HPI      Past Medical History:  Diagnosis Date  . Hypertension     There are no problems to display for this patient.   Past Surgical History:  Procedure Laterality Date  . APPENDECTOMY    . CESAREAN SECTION      OB History   No obstetric history on file.      Home Medications    Prior to Admission medications   Medication Sig Start Date End Date Taking? Authorizing Provider  amLODipine (NORVASC) 5 MG tablet Take 5 mg by mouth daily.   Yes [provider]  acetaminophen (TYLENOL) 325 MG tablet Take 650 mg by mouth every 6 (six) hours as needed for mild pain or fever.    [provider]  amLODipine (NORVASC) 5 MG tablet Take 1 tablet (5 mg total) by mouth daily. 11/28/19   Hoy Register, MD  amoxicillin-clavulanate (AUGMENTIN) 875-125 MG tablet Take 1 tablet by mouth every 12 (twelve) hours. 12/14/19   Dahlia Byes A, NP  meloxicam (MOBIC) 7.5 MG tablet Take 1 tablet (7.5 mg total) by mouth daily. Take in the morning, with food. 11/28/19   Hoy Register, MD  traMADol (ULTRAM) 50 MG tablet Take 1 tablet (50 mg total) by mouth every 6 (six) hours as needed.  12/14/19   Janace Aris, NP    Family History Family History  Problem Relation Age of Onset  . Breast cancer Mother   . Hypertension Mother   . Congestive Heart Failure Father   . Hypertension Father     Social History Social History   Tobacco Use  . Smoking status: Never Smoker  . Smokeless tobacco: Never Used  Substance Use Topics  . Alcohol use: Yes    Comment: occ  . Drug use: No     Allergies   Patient has no known allergies.   Review of Systems Review of Systems   Physical Exam Triage Vital Signs ED Triage Vitals [12/14/19 1142]  Enc Vitals Group     BP (!) 169/82     Pulse Rate 65     Resp 18     Temp 98.1 F (36.7 C)     Temp Source Oral     SpO2 99 %     Weight (!) 322 lb (146.1 kg)     Height 5\' 8"  (1.727 m)     Head Circumference      Peak Flow      Pain Score 10     Pain Loc  Pain Edu?      Excl. in GC?    No data found.  Updated Vital Signs BP (!) 169/82   Pulse 65   Temp 98.1 F (36.7 C) (Oral)   Resp 18   Ht 5\' 8"  (1.727 m)   Wt (!) 322 lb (146.1 kg)   SpO2 99%   BMI 48.96 kg/m   Visual Acuity Right Eye Distance:   Left Eye Distance:   Bilateral Distance:    Right Eye Near:   Left Eye Near:    Bilateral Near:     Physical Exam Vitals and nursing note reviewed.  Constitutional:      General: She is not in acute distress.    Appearance: Normal appearance. She is not ill-appearing, toxic-appearing or diaphoretic.  HENT:     Head: Normocephalic.     Nose: Nose normal.  Eyes:     Conjunctiva/sclera: Conjunctivae normal.  Pulmonary:     Effort: Pulmonary effort is normal.  Musculoskeletal:        General: Normal range of motion.     Cervical back: Normal range of motion.     Comments: See picture for detail   Skin:    General: Skin is warm and dry.     Findings: No rash.  Neurological:     Mental Status: She is alert.  Psychiatric:        Mood and Affect: Mood normal.        UC Treatments / Results    Labs (all labs ordered are listed, but only abnormal results are displayed) Labs Reviewed - No data to display  EKG   Radiology DG Hand Complete Left  Result Date: 12/14/2019 CLINICAL DATA:  Left hand pain, swelling and erythema since injury at work last week. Open draining wound over the 5th digit. EXAM: LEFT HAND - COMPLETE 3+ VIEW COMPARISON:  None. FINDINGS: The mineralization and alignment are normal. There is no evidence of acute fracture or dislocation. The joint spaces are preserved. No evidence of bone destruction. There is diffuse soft tissue swelling throughout the hand, especially dorsally. There is focal swelling over the dorsal aspect of the 5th MCP joint. No foreign body or definite soft tissue emphysema. IMPRESSION: 1. Diffuse soft tissue swelling, especially adjacent to the 5th MCP joint. 2. No radiopaque foreign body or evidence of osteomyelitis. Electronically Signed   By: 02/13/2020 M.D.   On: 12/14/2019 12:16    Procedures Procedures (including critical care time)  Medications Ordered in UC Medications  cefTRIAXone (ROCEPHIN) injection 1 g (1 g Intramuscular Given 12/14/19 1254)  ketorolac (TORADOL) 30 MG/ML injection 30 mg (30 mg Intravenous Given 12/14/19 1259)    Initial Impression / Assessment and Plan / UC Course  I have reviewed the triage vital signs and the nursing notes.  Pertinent labs & imaging results that were available during my care of the patient were reviewed by me and considered in my medical decision making (see chart for details).     Left hand pain, swelling most likely due to some sort of infection.  Abscess to dorsum of hand near fifth digit Very limited range of motion of hand due to swelling and pain. Will get x-ray to rule out osteomyelitis and underlying fracture.  X ray negative  Abscess to dorsum of hand made very small opening to better drain the abscess.  Purulent bloody drainage.  Wrapped hand 1 g rocephin given here and  Toradol for pain Sending home with Augmentin and  tramadol for pain.  Will have pt follow up in 2 days for recheck.  Return sooner if worse.     Final Clinical Impressions(s) / UC Diagnoses   Final diagnoses:  Swelling of left hand     Discharge Instructions     Treating you for hand infection Antibiotic injection given here for infection and Toradol given here for pain Sending more antibiotics to the pharmacy to start taking today. Tramadol for severe pain. I would like for you to follow-up and 2 days for recheck to ensure the infection is improving.  Please follow-up sooner if worsening      ED Prescriptions    Medication Sig Dispense Auth. Provider   amoxicillin-clavulanate (AUGMENTIN) 875-125 MG tablet Take 1 tablet by mouth every 12 (twelve) hours. 14 tablet Aislinn Feliz A, NP   traMADol (ULTRAM) 50 MG tablet Take 1 tablet (50 mg total) by mouth every 6 (six) hours as needed. 15 tablet Azariah Bonura A, NP     I have reviewed the PDMP during this encounter.   Loura Halt A, NP 12/15/19 [phone]

## 2019-12-14 NOTE — Discharge Instructions (Addendum)
Treating you for hand infection Antibiotic injection given here for infection and Toradol given here for pain Sending more antibiotics to the pharmacy to start taking today. Tramadol for severe pain. I would like for you to follow-up and 2 days for recheck to ensure the infection is improving.  Please follow-up sooner if worsening

## 2019-12-14 NOTE — ED Triage Notes (Signed)
Pt states she hit her left hand on something at the job last week and it left a small bump, but the bump has grown over the past wk. Pt has a abcess on top of the 5th knuckle of left hand, it's ecchymotic and has bloody drainage. Left hand is erythematous and hand and fingers are 2+ edema. Hand is hot to touch. 2+ left radial pulse, cap refill less than 3 sec, pt able to wiggle finger.

## 2019-12-16 ENCOUNTER — Other Ambulatory Visit: Payer: Self-pay

## 2019-12-16 ENCOUNTER — Ambulatory Visit (HOSPITAL_COMMUNITY)
Admission: EM | Admit: 2019-12-16 | Discharge: 2019-12-16 | Disposition: A | Discharge status: Home / Self Care | Payer: Self-pay

## 2019-12-16 ENCOUNTER — Encounter (HOSPITAL_COMMUNITY): Payer: Self-pay

## 2019-12-16 DIAGNOSIS — Z5189 Encounter for other specified aftercare: Secondary | ICD-10-CM

## 2019-12-16 NOTE — ED Triage Notes (Signed)
Pt here for a f/u for a wound check on left posterior of hand.

## 2019-12-16 NOTE — Discharge Instructions (Addendum)
The wound appears to be healing Take the antibiotics as prescribed. Finish  them all  Pain medications as needed.  Keep draining the abscess

## 2019-12-17 NOTE — ED Provider Notes (Signed)
[facility]    CSN: [id number] Arrival date & time: 12/16/19  1025      History   Chief Complaint Chief Complaint  Patient presents with  . wound check    HPI Mary Mathews is a 44 y.o. female.   Pt is a 44 year old female that presents today for wound recheck. She was seen here 2 days ago and treated for abscess to the hand and infection. Since she has seen great improvement with decrease swelling, pain. The hand has continued to drain. No fever. She has been taking her antibiotics as prescribed.   ROS per HPI      Past Medical History:  Diagnosis Date  . Hypertension     There are no problems to display for this patient.   Past Surgical History:  Procedure Laterality Date  . APPENDECTOMY    . CESAREAN SECTION      OB History   No obstetric history on file.      Home Medications    Prior to Admission medications   Medication Sig Start Date End Date Taking? Authorizing Provider  acetaminophen (TYLENOL) 325 MG tablet Take 650 mg by mouth every 6 (six) hours as needed for mild pain or fever.    [provider]  amLODipine (NORVASC) 5 MG tablet Take 1 tablet (5 mg total) by mouth daily. 11/28/19   Hoy Register, MD  amLODipine (NORVASC) 5 MG tablet Take 5 mg by mouth daily.    [provider]  amoxicillin-clavulanate (AUGMENTIN) 875-125 MG tablet Take 1 tablet by mouth every 12 (twelve) hours. 12/14/19   Dahlia Byes A, NP  meloxicam (MOBIC) 7.5 MG tablet Take 1 tablet (7.5 mg total) by mouth daily. Take in the morning, with food. 11/28/19   Hoy Register, MD  traMADol (ULTRAM) 50 MG tablet Take 1 tablet (50 mg total) by mouth every 6 (six) hours as needed. 12/14/19   Janace Aris, NP    Family History Family History  Problem Relation Age of Onset  . Breast cancer Mother   . Hypertension Mother   . Congestive Heart Failure Father   . Hypertension Father     Social History Social History   Tobacco Use  . Smoking status:  Never Smoker  . Smokeless tobacco: Never Used  Substance Use Topics  . Alcohol use: Yes    Comment: occ  . Drug use: No     Allergies   Patient has no known allergies.   Review of Systems Review of Systems   Physical Exam Triage Vital Signs ED Triage Vitals  Enc Vitals Group     BP 12/16/19 1058 (!) 150/84     Pulse Rate 12/16/19 1058 74     Resp 12/16/19 1058 18     Temp 12/16/19 1058 98.2 F (36.8 C)     Temp Source 12/16/19 1058 Oral     SpO2 12/16/19 1058 96 %     Weight 12/16/19 1059 (!) 322 lb (146.1 kg)     Height 12/16/19 1059 5\' 8"  (1.727 m)     Head Circumference --      Peak Flow --      Pain Score 12/16/19 1058 6     Pain Loc --      Pain Edu? --      Excl. in GC? --    No data found.  Updated Vital Signs BP (!) 150/84   Pulse 74   Temp 98.2 F (36.8 C) (Oral)  Resp 18   Ht 5\' 8"  (1.727 m)   Wt (!) 322 lb (146.1 kg)   SpO2 96%   BMI 48.96 kg/m   Visual Acuity Right Eye Distance:   Left Eye Distance:   Bilateral Distance:    Right Eye Near:   Left Eye Near:    Bilateral Near:     Physical Exam Vitals and nursing note reviewed.  Constitutional:      General: She is not in acute distress.    Appearance: Normal appearance. She is not ill-appearing, toxic-appearing or diaphoretic.  HENT:     Head: Normocephalic.     Nose: Nose normal.  Eyes:     Conjunctiva/sclera: Conjunctivae normal.  Pulmonary:     Effort: Pulmonary effort is normal.  Musculoskeletal:        General: Normal range of motion.     Cervical back: Normal range of motion.  Skin:    General: Skin is warm and dry.     Findings: No rash.     Comments: Mild right hand swelling with erythema, abscess to dorsum still draining. Greatly improved. Increased ROM.   Neurological:     Mental Status: She is alert.  Psychiatric:        Mood and Affect: Mood normal.      UC Treatments / Results  Labs (all labs ordered are listed, but only abnormal results are  displayed) Labs Reviewed - No data to display  EKG   Radiology No results found.  Procedures Procedures (including critical care time)  Medications Ordered in UC Medications - No data to display  Initial Impression / Assessment and Plan / UC Course  I have reviewed the triage vital signs and the nursing notes.  Pertinent labs & imaging results that were available during my care of the patient were reviewed by me and considered in my medical decision making (see chart for details).     Wound check abscess- hand is healing  and there is great improvement.  Will have her keep monitoring and finish the antibiotics. Follow up as needed for continued or worsening symptoms  Final Clinical Impressions(s) / UC Diagnoses   Final diagnoses:  Wound check, abscess     Discharge Instructions     The wound appears to be healing Take the antibiotics as prescribed. Finish  them all  Pain medications as needed.  Keep draining the abscess      ED Prescriptions    None     PDMP not reviewed this encounter.   Orvan July, NP 12/17/19 [phone]

## 2020-02-14 ENCOUNTER — Other Ambulatory Visit: Payer: Self-pay

## 2020-02-14 ENCOUNTER — Encounter: Payer: Self-pay | Admitting: Family Medicine

## 2020-02-14 ENCOUNTER — Ambulatory Visit: Payer: Self-pay | Attending: Family Medicine | Admitting: Family Medicine

## 2020-02-14 VITALS — BP 120/78 | HR 76 | Ht 68.0 in | Wt 307.4 lb

## 2020-02-14 DIAGNOSIS — Z1231 Encounter for screening mammogram for malignant neoplasm of breast: Secondary | ICD-10-CM

## 2020-02-14 DIAGNOSIS — Z124 Encounter for screening for malignant neoplasm of cervix: Secondary | ICD-10-CM

## 2020-02-14 DIAGNOSIS — Z0000 Encounter for general adult medical examination without abnormal findings: Secondary | ICD-10-CM

## 2020-02-14 DIAGNOSIS — Z111 Encounter for screening for respiratory tuberculosis: Secondary | ICD-10-CM

## 2020-02-14 DIAGNOSIS — Z1159 Encounter for screening for other viral diseases: Secondary | ICD-10-CM

## 2020-02-14 NOTE — Progress Notes (Signed)
Subjective:  Patient ID: Mary Mathews, female    DOB: Nov 03, 1975  Age: 44 y.o. MRN: [id number]  CC: Annual Exam and Gynecologic Exam   HPI Mary Mathews presents for a complete physical exam She is due for mammogram and Pap smear. She requests a TB test as well. She has a family history of breast cancer in her mom who was diagnosed at the age of 107 and colon cancer in her grandmother who was diagnosed at age of 37. Past Medical History:  Diagnosis Date  . Hypertension     Past Surgical History:  Procedure Laterality Date  . APPENDECTOMY    . CESAREAN SECTION      Family History  Problem Relation Age of Onset  . Breast cancer Mother   . Hypertension Mother   . Congestive Heart Failure Father   . Hypertension Father     No Known Allergies  Outpatient Medications Prior to Visit  Medication Sig Dispense Refill  . acetaminophen (TYLENOL) 325 MG tablet Take 650 mg by mouth every 6 (six) hours as needed for mild pain or fever.    Marland Kitchen amLODipine (NORVASC) 5 MG tablet Take 1 tablet (5 mg total) by mouth daily. 30 tablet 3  . meloxicam (MOBIC) 7.5 MG tablet Take 1 tablet (7.5 mg total) by mouth daily. Take in the morning, with food. 30 tablet 3  . amLODipine (NORVASC) 5 MG tablet Take 5 mg by mouth daily.    Marland Kitchen amoxicillin-clavulanate (AUGMENTIN) 875-125 MG tablet Take 1 tablet by mouth every 12 (twelve) hours. (Patient not taking: Reported on 02/14/2020) 14 tablet 0  . traMADol (ULTRAM) 50 MG tablet Take 1 tablet (50 mg total) by mouth every 6 (six) hours as needed. (Patient not taking: Reported on 02/14/2020) 15 tablet 0   No facility-administered medications prior to visit.     ROS Review of Systems  Constitutional: Negative for activity change, appetite change and fatigue.  HENT: Negative for congestion, sinus pressure and sore throat.   Eyes: Negative for visual disturbance.  Respiratory: Negative for cough, chest tightness, shortness of breath and wheezing.     Cardiovascular: Negative for chest pain and palpitations.  Gastrointestinal: Negative for abdominal distention, abdominal pain and constipation.  Endocrine: Negative for polydipsia.  Genitourinary: Negative for dysuria and frequency.  Musculoskeletal: Negative for arthralgias and back pain.  Skin: Negative for rash.  Neurological: Negative for tremors, light-headedness and numbness.  Hematological: Does not bruise/bleed easily.  Psychiatric/Behavioral: Negative for agitation and behavioral problems.    Objective:  BP 120/78   Pulse 76   Ht 5\' 8"  (1.727 m)   Wt (!) 307 lb 6.4 oz (139.4 kg)   SpO2 97%   BMI 46.74 kg/m   BP/Weight 02/14/2020 12/16/2019 12/14/2019  Systolic BP 120 150 169  Diastolic BP 78 84 82  Wt. (Lbs) 307.4 322 322  BMI 46.74 48.96 48.96      Physical Exam Constitutional:      General: She is not in acute distress.    Appearance: She is well-developed. She is obese. She is not diaphoretic.  HENT:     Head: Normocephalic.     Right Ear: External ear normal.     Left Ear: External ear normal.     Nose: Nose normal.  Eyes:     Conjunctiva/sclera: Conjunctivae normal.     Pupils: Pupils are equal, round, and reactive to light.  Neck:     Vascular: No JVD.  Cardiovascular:     Rate and  Rhythm: Normal rate and regular rhythm.     Heart sounds: Normal heart sounds. No murmur. No gallop.   Pulmonary:     Effort: Pulmonary effort is normal. No respiratory distress.     Breath sounds: Normal breath sounds. No wheezing or rales.  Chest:     Chest wall: No tenderness.     Breasts:        Right: No mass or tenderness.        Left: No mass or tenderness.  Abdominal:     General: Bowel sounds are normal. There is no distension.     Palpations: Abdomen is soft. There is no mass.     Tenderness: There is no abdominal tenderness.  Musculoskeletal:        General: No tenderness. Normal range of motion.     Cervical back: Normal range of motion.  Skin:     General: Skin is warm and dry.  Neurological:     Mental Status: She is alert and oriented to person, place, and time.     Deep Tendon Reflexes: Reflexes are normal and symmetric.     CMP Latest Ref Rng & Units 11/09/2019  Glucose 70 - 99 mg/dL 98  BUN 6 - 20 mg/dL 11  Creatinine 0.44 - 1.00 mg/dL 0.75  Sodium 135 - 145 mmol/L 139  Potassium 3.5 - 5.1 mmol/L 3.9  Chloride 98 - 111 mmol/L 106  CO2 22 - 32 mmol/L 22  Calcium 8.9 - 10.3 mg/dL 8.8(L)    Lipid Panel  No results found for: CHOL, TRIG, HDL, CHOLHDL, VLDL, LDLCALC, LDLDIRECT  CBC    Component Value Date/Time   WBC 8.4 11/09/2019 1734   RBC 4.35 11/09/2019 1734   HGB 12.3 11/09/2019 1734   HCT 38.5 11/09/2019 1734   PLT 232 11/09/2019 1734   MCV 88.5 11/09/2019 1734   MCH 28.3 11/09/2019 1734   MCHC 31.9 11/09/2019 1734   RDW 14.0 11/09/2019 1734    No results found for: HGBA1C  Assessment & Plan:  1. Annual physical exam Counseled on 150 minutes of exercise per week, healthy eating (including decreased daily intake of saturated fats, cholesterol, added sugars, sodium), routine healthcare maintenance. - HIV antibody (with reflex)  2. Need for hepatitis C screening test - HCV RNA quant rflx ultra or genotyp([facility])  3. Encounter for screening mammogram for malignant neoplasm of breast - MM 3D SCREEN BREAST BILATERAL; Future  4. Screening for cervical cancer - Cytology - PAP([facility]) - Cervicovaginal ancillary only  5. Screening for tuberculosis - QuantiFERON-TB Gold Plus  No orders of the defined types were placed in this encounter.   Follow-up: No follow-ups on file.       Charlott Rakes, MD, FAAFP. [facility] [place], [place]   02/14/2020, 10:32 AM

## 2020-02-14 NOTE — Patient Instructions (Signed)
Health Maintenance, Female Adopting a healthy lifestyle and getting preventive care are important in promoting health and wellness. Ask your health care provider about:  The right schedule for you to have regular tests and exams.  Things you can do on your own to prevent diseases and keep yourself healthy. What should I know about diet, weight, and exercise? Eat a healthy diet   Eat a diet that includes plenty of vegetables, fruits, low-fat dairy products, and lean protein.  Do not eat a lot of foods that are high in solid fats, added sugars, or sodium. Maintain a healthy weight Body mass index (BMI) is used to identify weight problems. It estimates body fat based on height and weight. Your health care provider can help determine your BMI and help you achieve or maintain a healthy weight. Get regular exercise Get regular exercise. This is one of the most important things you can do for your health. Most adults should:  Exercise for at least 150 minutes each week. The exercise should increase your heart rate and make you sweat (moderate-intensity exercise).  Do strengthening exercises at least twice a week. This is in addition to the moderate-intensity exercise.  Spend less time sitting. Even light physical activity can be beneficial. Watch cholesterol and blood lipids Have your blood tested for lipids and cholesterol at 45 years of age, then have this test every 5 years. Have your cholesterol levels checked more often if:  Your lipid or cholesterol levels are high.  You are older than 45 years of age.  You are at high risk for heart disease. What should I know about cancer screening? Depending on your health history and family history, you may need to have cancer screening at various ages. This may include screening for:  Breast cancer.  Cervical cancer.  Colorectal cancer.  Skin cancer.  Lung cancer. What should I know about heart disease, diabetes, and high blood  pressure? Blood pressure and heart disease  High blood pressure causes heart disease and increases the risk of stroke. This is more likely to develop in people who have high blood pressure readings, are of African descent, or are overweight.  Have your blood pressure checked: ? Every 3-5 years if you are 18-39 years of age. ? Every year if you are 40 years old or older. Diabetes Have regular diabetes screenings. This checks your fasting blood sugar level. Have the screening done:  Once every three years after age 40 if you are at a normal weight and have a low risk for diabetes.  More often and at a younger age if you are overweight or have a high risk for diabetes. What should I know about preventing infection? Hepatitis B If you have a higher risk for hepatitis B, you should be screened for this virus. Talk with your health care provider to find out if you are at risk for hepatitis B infection. Hepatitis C Testing is recommended for:  Everyone born from 1945 through 1965.  Anyone with known risk factors for hepatitis C. Sexually transmitted infections (STIs)  Get screened for STIs, including gonorrhea and chlamydia, if: ? You are sexually active and are younger than 45 years of age. ? You are older than 45 years of age and your health care provider tells you that you are at risk for this type of infection. ? Your sexual activity has changed since you were last screened, and you are at increased risk for chlamydia or gonorrhea. Ask your health care provider if   you are at risk.  Ask your health care provider about whether you are at high risk for HIV. Your health care provider may recommend a prescription medicine to help prevent HIV infection. If you choose to take medicine to prevent HIV, you should first get tested for HIV. You should then be tested every 3 months for as long as you are taking the medicine. Pregnancy  If you are about to stop having your period (premenopausal) and  you may become pregnant, seek counseling before you get pregnant.  Take 400 to 800 micrograms (mcg) of folic acid every day if you become pregnant.  Ask for birth control (contraception) if you want to prevent pregnancy. Osteoporosis and menopause Osteoporosis is a disease in which the bones lose minerals and strength with aging. This can result in bone fractures. If you are 65 years old or older, or if you are at risk for osteoporosis and fractures, ask your health care provider if you should:  Be screened for bone loss.  Take a calcium or vitamin D supplement to lower your risk of fractures.  Be given hormone replacement therapy (HRT) to treat symptoms of menopause. Follow these instructions at home: Lifestyle  Do not use any products that contain nicotine or tobacco, such as cigarettes, e-cigarettes, and chewing tobacco. If you need help quitting, ask your health care provider.  Do not use street drugs.  Do not share needles.  Ask your health care provider for help if you need support or information about quitting drugs. Alcohol use  Do not drink alcohol if: ? Your health care provider tells you not to drink. ? You are pregnant, may be pregnant, or are planning to become pregnant.  If you drink alcohol: ? Limit how much you use to 0-1 drink a day. ? Limit intake if you are breastfeeding.  Be aware of how much alcohol is in your drink. In the [place] one drink equals one 12 oz bottle of beer (355 mL), one 5 oz glass of wine (148 mL), or one 1 oz glass of hard liquor (44 mL). General instructions  Schedule regular health, dental, and eye exams.  Stay current with your vaccines.  Tell your health care provider if: ? You often feel depressed. ? You have ever been abused or do not feel safe at home. Summary  Adopting a healthy lifestyle and getting preventive care are important in promoting health and wellness.  Follow your health care provider's instructions about healthy  diet, exercising, and getting tested or screened for diseases.  Follow your health care provider's instructions on monitoring your cholesterol and blood pressure. This information is not intended to replace advice given to you by your health care provider. Make sure you discuss any questions you have with your health care provider. Document Revised: 08/18/2018 Document Reviewed: 08/18/2018 Elsevier Patient Education  2020 Elsevier Inc.  

## 2020-02-15 LAB — CERVICOVAGINAL ANCILLARY ONLY
Bacterial Vaginitis (gardnerella): POSITIVE — AB
Candida Glabrata: NEGATIVE
Candida Vaginitis: NEGATIVE
Chlamydia: NEGATIVE
Comment: NEGATIVE
Comment: NEGATIVE
Comment: NEGATIVE
Comment: NEGATIVE
Comment: NEGATIVE
Comment: NORMAL
Neisseria Gonorrhea: NEGATIVE
Trichomonas: POSITIVE — AB

## 2020-02-15 LAB — CYTOLOGY - PAP
Comment: NEGATIVE
Diagnosis: NEGATIVE
High risk HPV: NEGATIVE

## 2020-02-16 ENCOUNTER — Other Ambulatory Visit: Payer: Self-pay | Admitting: Family Medicine

## 2020-02-16 MED ORDER — METRONIDAZOLE 500 MG PO TABS: 500.0000 mg | ORAL_TABLET | Freq: Two times a day (BID) | ORAL | 0 refills | Status: AC

## 2020-02-17 ENCOUNTER — Telehealth: Payer: Self-pay

## 2020-02-17 LAB — QUANTIFERON-TB GOLD PLUS
QuantiFERON Mitogen Value: >10 IU/mL
QuantiFERON Nil Value: 0.02 IU/mL
QuantiFERON TB1 Ag Value: 0.04 IU/mL
QuantiFERON TB2 Ag Value: 0.03 IU/mL
QuantiFERON-TB Gold Plus: NEGATIVE

## 2020-02-17 LAB — HCV RNA QUANT RFLX ULTRA OR GENOTYP: HCV Quant Baseline: NOT DETECTED IU/mL

## 2020-02-17 LAB — HIV ANTIBODY (ROUTINE TESTING W REFLEX): HIV Screen 4th Generation wRfx: NONREACTIVE

## 2020-02-17 NOTE — Telephone Encounter (Signed)
Patient was called and a voicemail was left informing patient to return phone call for lab results.  A letter will also be mailed out to patient.

## 2020-02-17 NOTE — Telephone Encounter (Signed)
-----   Message from Hoy Register, MD sent at 02/16/2020  1:51 PM EDT ----- Can you please ensure she has seen the my chart message? Thanks

## 2020-03-28 ENCOUNTER — Encounter: Payer: Self-pay | Admitting: Family Medicine

## 2020-03-28 ENCOUNTER — Ambulatory Visit: Payer: Self-pay | Attending: Family Medicine | Admitting: Family Medicine

## 2020-03-28 ENCOUNTER — Other Ambulatory Visit: Payer: Self-pay

## 2020-03-28 VITALS — BP 131/77 | HR 57 | Ht 68.0 in | Wt 310.0 lb

## 2020-03-28 DIAGNOSIS — I10 Essential (primary) hypertension: Secondary | ICD-10-CM

## 2020-03-28 DIAGNOSIS — A5901 Trichomonal vulvovaginitis: Secondary | ICD-10-CM

## 2020-03-28 MED ORDER — AMLODIPINE BESYLATE 5 MG PO TABS: 5.0000 mg | ORAL_TABLET | Freq: Every day | ORAL | 6 refills | Status: AC

## 2020-03-28 NOTE — Progress Notes (Signed)
Needs to retest for STD.

## 2020-03-28 NOTE — Progress Notes (Signed)
Subjective:  Patient ID: Mary Mathews, female    DOB: 09/22/1975  Age: 44 y.o. MRN: [id number]  CC: Hypertension   HPI Mary Mathews is a 44 year old female with a history of hypertension who was diagnosed with trichomonas vaginitis 6 weeks ago and presents today for retest.  She was treated with metronidazole after her diagnosis. She is asymptomatic and informs me her partner was asymptomatic and got treated. She has no additional concerns today.  Past Medical History:  Diagnosis Date  . Hypertension     Past Surgical History:  Procedure Laterality Date  . APPENDECTOMY    . CESAREAN SECTION      Family History  Problem Relation Age of Onset  . Breast cancer Mother   . Hypertension Mother   . Congestive Heart Failure Father   . Hypertension Father     No Known Allergies  Outpatient Medications Prior to Visit  Medication Sig Dispense Refill  . meloxicam (MOBIC) 7.5 MG tablet Take 1 tablet (7.5 mg total) by mouth daily. Take in the morning, with food. 30 tablet 3  . amLODipine (NORVASC) 5 MG tablet Take 1 tablet (5 mg total) by mouth daily. 30 tablet 3  . acetaminophen (TYLENOL) 325 MG tablet Take 650 mg by mouth every 6 (six) hours as needed for mild pain or fever.    Marland Kitchen amoxicillin-clavulanate (AUGMENTIN) 875-125 MG tablet Take 1 tablet by mouth every 12 (twelve) hours. (Patient not taking: Reported on 02/14/2020) 14 tablet 0  . metroNIDAZOLE (FLAGYL) 500 MG tablet Take 1 tablet (500 mg total) by mouth 2 (two) times daily. (Patient not taking: Reported on 03/28/2020) 14 tablet 0  . traMADol (ULTRAM) 50 MG tablet Take 1 tablet (50 mg total) by mouth every 6 (six) hours as needed. (Patient not taking: Reported on 02/14/2020) 15 tablet 0  . amLODipine (NORVASC) 5 MG tablet Take 5 mg by mouth daily.     No facility-administered medications prior to visit.     ROS Review of Systems  Constitutional: Negative for activity change, appetite change and fatigue.  HENT:  Negative for congestion, sinus pressure and sore throat.   Eyes: Negative for visual disturbance.  Respiratory: Negative for cough, chest tightness, shortness of breath and wheezing.   Cardiovascular: Negative for chest pain and palpitations.  Gastrointestinal: Negative for abdominal distention, abdominal pain and constipation.  Endocrine: Negative for polydipsia.  Genitourinary: Negative for dysuria and frequency.  Musculoskeletal: Negative for arthralgias and back pain.  Skin: Negative for rash.  Neurological: Negative for tremors, light-headedness and numbness.  Hematological: Does not bruise/bleed easily.  Psychiatric/Behavioral: Negative for agitation and behavioral problems.    Objective:  BP 131/77   Pulse (!) 57   Ht 5\' 8"  (1.727 m)   Wt (!) 310 lb (140.6 kg)   SpO2 97%   BMI 47.14 kg/m   BP/Weight 03/28/2020 02/14/2020 12/16/2019  Systolic BP 131 120 150  Diastolic BP 77 78 84  Wt. (Lbs) 310 307.4 322  BMI 47.14 46.74 48.96      Physical Exam Constitutional:      Appearance: She is well-developed.  Neck:     Vascular: No JVD.  Cardiovascular:     Rate and Rhythm: Normal rate.     Heart sounds: Normal heart sounds. No murmur heard.   Pulmonary:     Effort: Pulmonary effort is normal.     Breath sounds: Normal breath sounds. No wheezing or rales.  Chest:     Chest wall: No tenderness.  Abdominal:     General: Bowel sounds are normal. There is no distension.     Palpations: Abdomen is soft. There is no mass.     Tenderness: There is no abdominal tenderness.  Musculoskeletal:        General: Normal range of motion.     Right lower leg: No edema.     Left lower leg: No edema.  Neurological:     Mental Status: She is alert and oriented to person, place, and time.  Psychiatric:        Mood and Affect: Mood normal.     CMP Latest Ref Rng & Units 11/09/2019  Glucose 70 - 99 mg/dL 98  BUN 6 - 20 mg/dL 11  Creatinine 1.01 - 7.51 mg/dL 0.25  Sodium 852 - 778  mmol/L 139  Potassium 3.5 - 5.1 mmol/L 3.9  Chloride 98 - 111 mmol/L 106  CO2 22 - 32 mmol/L 22  Calcium 8.9 - 10.3 mg/dL 2.4(M)    Lipid Panel  No results found for: CHOL, TRIG, HDL, CHOLHDL, VLDL, LDLCALC, LDLDIRECT  CBC    Component Value Date/Time   WBC 8.4 11/09/2019 1734   RBC 4.35 11/09/2019 1734   HGB 12.3 11/09/2019 1734   HCT 38.5 11/09/2019 1734   PLT 232 11/09/2019 1734   MCV 88.5 11/09/2019 1734   MCH 28.3 11/09/2019 1734   MCHC 31.9 11/09/2019 1734   RDW 14.0 11/09/2019 1734    No results found for: HGBA1C  Assessment & Plan:  1. Essential hypertension Controlled Counseled on blood pressure goal of less than 130/80, low-sodium, DASH diet, medication compliance, 150 minutes of moderate intensity exercise per week. Discussed medication compliance, adverse effects. - amLODipine (NORVASC) 5 MG tablet; Take 1 tablet (5 mg total) by mouth daily.  Dispense: 30 tablet; Refill: 6 - LP+Non-HDL Cholesterol  2. Trichomonas vaginitis Treated We will perform retest - Cervicovaginal ancillary only    Meds ordered this encounter  Medications  . amLODipine (NORVASC) 5 MG tablet    Sig: Take 1 tablet (5 mg total) by mouth daily.    Dispense:  30 tablet    Refill:  6    Follow-up: Return in about 6 months (around 09/28/2020) for Chronic disease management.       Hoy Register, MD, FAAFP. [facility] [place], [place] [phone]   03/28/2020, 12:48 PM

## 2020-03-28 NOTE — Patient Instructions (Signed)
Trichomoniasis Trichomoniasis is an STI (sexually transmitted infection) that can affect both women and men. In women, the outer area of the female genitalia (vulva) and the vagina are affected. In men, mainly the penis is affected, but the prostate and other reproductive organs can also be involved.  This condition can be treated with medicine. It often has no symptoms (is asymptomatic), especially in men. If not treated, trichomoniasis can last for months or years. What are the causes? This condition is caused by a parasite called Trichomonas vaginalis. Trichomoniasis most often spreads from person to person (is contagious) through sexual contact. What increases the risk? The following factors may make you more likely to develop this condition:  Having unprotected sex.  Having sex with a partner who has trichomoniasis.  Having multiple sexual partners.  Having had previous trichomoniasis infections or other STIs. What are the signs or symptoms? In women, symptoms of trichomoniasis include:  Abnormal vaginal discharge that is clear, white, gray, or yellow-green and foamy and has an unusual "fishy" odor.  Itching and irritation of the vagina and vulva.  Burning or pain during urination or sex.  Redness and swelling of the genitals. In men, symptoms of trichomoniasis include:  Penile discharge that may be foamy or contain pus.  Pain in the penis. This may happen only when urinating.  Itching or irritation inside the penis.  Burning after urination or ejaculation. How is this diagnosed? In women, this condition may be found during a routine Pap test or physical exam. It may be found in men during a routine physical exam. Your health care provider may do tests to help diagnose this infection, such as:  Urine tests (men and women).  The following in women: ? Testing the pH of the vagina. ? A vaginal swab test that checks for the Trichomonas vaginalis parasite. ? Testing vaginal  secretions. Your health care provider may test you for other STIs, including HIV (human immunodeficiency virus). How is this treated? This condition is treated with medicine taken by mouth (orally), such as metronidazole or tinidazole, to fight the infection. Your sexual partner(s) also need to be tested and treated.  If you are a woman and you plan to become pregnant or think you may be pregnant, tell your health care provider right away. Some medicines that are used to treat the infection should not be taken during pregnancy. Your health care provider may recommend over-the-counter medicines or creams to help relieve itching or irritation. You may be tested for infection again 3 months after treatment. Follow these instructions at home:  Take and use over-the-counter and prescription medicines, including creams, only as told by your health care provider.  Take your antibiotic medicine as told by your health care provider. Do not stop taking the antibiotic even if you start to feel better.  Do not have sex until 7-10 days after you finish your medicine, or until your health care provider approves. Ask your health care provider when you may start to have sex again.  (Women) Do not douche or wear tampons while you have the infection.  Discuss your infection with your sexual partner(s). Make sure that your partner gets tested and treated, if necessary.  Keep all follow-up visits as told by your health care provider. This is important. How is this prevented?   Use condoms every time you have sex. Using condoms correctly and consistently can help protect against STIs.  Avoid having multiple sexual partners.  Talk with your sexual partner about any   symptoms that either of you may have, as well as any history of STIs.  Get tested for STIs and STDs (sexually transmitted diseases) before you have sex. Ask your partner to do the same.  Do not have sexual contact if you have symptoms of  trichomoniasis or another STI. Contact a health care provider if:  You still have symptoms after you finish your medicine.  You develop pain in your abdomen.  You have pain when you urinate.  You have bleeding after sex.  You develop a rash.  You feel nauseous or you vomit.  You plan to become pregnant or think you may be pregnant. Summary  Trichomoniasis is an STI (sexually transmitted infection) that can affect both women and men.  This condition often has no symptoms (is asymptomatic), especially in men.  Without treatment, this condition can last for months or years.  You should not have sex until 7-10 days after you finish your medicine, or until your health care provider approves. Ask your health care provider when you may start to have sex again.  Discuss your infection with your sexual partner(s). Make sure that your partner gets tested and treated, if necessary. This information is not intended to replace advice given to you by your health care provider. Make sure you discuss any questions you have with your health care provider. Document Revised: 06/08/2018 Document Reviewed: 06/08/2018 Elsevier Patient Education  2020 Elsevier Inc.  

## 2020-03-29 LAB — CERVICOVAGINAL ANCILLARY ONLY
Bacterial Vaginitis (gardnerella): NEGATIVE
Candida Glabrata: NEGATIVE
Candida Vaginitis: NEGATIVE
Chlamydia: NEGATIVE
Comment: NEGATIVE
Comment: NEGATIVE
Comment: NEGATIVE
Comment: NEGATIVE
Comment: NEGATIVE
Comment: NORMAL
Neisseria Gonorrhea: NEGATIVE
Trichomonas: NEGATIVE

## 2020-03-29 LAB — LP+NON-HDL CHOLESTEROL
Cholesterol, Total: 193 mg/dL (ref 100–199)
HDL: 43 mg/dL (ref >39)
LDL Chol Calc (NIH): 128 mg/dL — ABNORMAL HIGH (ref 0–99)
Total Non-HDL-Chol (LDL+VLDL): 150 mg/dL — ABNORMAL HIGH (ref 0–129)
Triglycerides: 122 mg/dL (ref 0–149)
VLDL Cholesterol Cal: 22 mg/dL (ref 5–40)

## 2020-09-19 ENCOUNTER — Encounter (INDEPENDENT_AMBULATORY_CARE_PROVIDER_SITE_OTHER): Payer: Self-pay

## 2020-12-03 ENCOUNTER — Encounter (HOSPITAL_COMMUNITY): Payer: Self-pay | Admitting: Emergency Medicine

## 2020-12-03 ENCOUNTER — Other Ambulatory Visit: Payer: Self-pay

## 2020-12-03 ENCOUNTER — Ambulatory Visit (HOSPITAL_COMMUNITY)
Admission: EM | Admit: 2020-12-03 | Discharge: 2020-12-03 | Disposition: A | Discharge status: Home / Self Care | Payer: Self-pay | Attending: Emergency Medicine | Admitting: Emergency Medicine

## 2020-12-03 DIAGNOSIS — L03311 Cellulitis of abdominal wall: Secondary | ICD-10-CM

## 2020-12-03 DIAGNOSIS — L0291 Cutaneous abscess, unspecified: Secondary | ICD-10-CM

## 2020-12-03 MED ORDER — CEFTRIAXONE SODIUM 1 G IJ SOLR
INTRAMUSCULAR | Status: AC
  Filled 2020-12-03: qty 10

## 2020-12-03 MED ORDER — CEFTRIAXONE SODIUM 1 G IJ SOLR
1.0000 g | Freq: Once | INTRAMUSCULAR | Status: AC
  Administered 2020-12-03: 1 g via INTRAMUSCULAR

## 2020-12-03 MED ORDER — SULFAMETHOXAZOLE-TRIMETHOPRIM 800-160 MG PO TABS: 1.0000 | ORAL_TABLET | Freq: Two times a day (BID) | ORAL | 0 refills | Status: AC

## 2020-12-03 MED ORDER — IBUPROFEN 600 MG PO TABS: 600.0000 mg | ORAL_TABLET | Freq: Four times a day (QID) | ORAL | 0 refills | Status: AC | PRN

## 2020-12-03 NOTE — Discharge Instructions (Addendum)
Take the Bactrim twice a day for the next 7 days.   You can take the Ibuprofen as needed for pain and swelling.  Apply a warm compress to the area for comfort.   If you develop a fever or notice the redness spreading beyond the purple line, go directly to the emergency department.

## 2020-12-03 NOTE — ED Triage Notes (Addendum)
Pt presents with abscess on stomach xs 4 days.   Pt states has not taken BP medication in over a month. States does not have PCP. Provider notified.

## 2020-12-03 NOTE — ED Provider Notes (Signed)
[facility]    CSN: [id number] Arrival date & time: 12/03/20  1053      History   Chief Complaint Chief Complaint  Patient presents with  . Abscess    HPI Mary Mathews is a 45 y.o. female.   Mary Mathews is a 45 y.o. here with chief complaint of abscess to right lower abdomen.  Reports first noticed pain and swelling last Wednesday.  Reports using Tylenol for pain and antibiotic ointment with no relief.  Reports noticed swelling and redness spreading for the past 2 days.  Reports minimal drainage.  Denies any specific alleviating or aggravating factors.  Denies any fevers, chest pain, shortness of breath, N/V/D, numbness, tingling, weakness, abdominal pain, or headaches.   ROS: As per HPI, all other pertinent ROS negative      The history is provided by the patient.  Abscess   Past Medical History:  Diagnosis Date  . Hypertension     There are no problems to display for this patient.   Past Surgical History:  Procedure Laterality Date  . APPENDECTOMY    . CESAREAN SECTION      OB History   No obstetric history on file.      Home Medications    Prior to Admission medications   Medication Sig Start Date End Date Taking? Authorizing Provider  ibuprofen (ADVIL) 600 MG tablet Take 1 tablet (600 mg total) by mouth every 6 (six) hours as needed. 12/03/20  Yes Ivette Loyal, NP  sulfamethoxazole-trimethoprim (BACTRIM DS) 800-160 MG tablet Take 1 tablet by mouth 2 (two) times daily for 7 days. 12/03/20 12/10/20 Yes Ivette Loyal, NP  acetaminophen (TYLENOL) 325 MG tablet Take 650 mg by mouth every 6 (six) hours as needed for mild pain or fever.    [provider]  amLODipine (NORVASC) 5 MG tablet Take 1 tablet (5 mg total) by mouth daily. 03/28/20   Hoy Register, MD  amoxicillin-clavulanate (AUGMENTIN) 875-125 MG tablet Take 1 tablet by mouth every 12 (twelve) hours. Patient not taking: Reported on 02/14/2020 12/14/19   Dahlia Byes A, NP   meloxicam (MOBIC) 7.5 MG tablet Take 1 tablet (7.5 mg total) by mouth daily. Take in the morning, with food. 11/28/19   Hoy Register, MD  metroNIDAZOLE (FLAGYL) 500 MG tablet Take 1 tablet (500 mg total) by mouth 2 (two) times daily. Patient not taking: Reported on 03/28/2020 02/16/20   Hoy Register, MD  traMADol (ULTRAM) 50 MG tablet Take 1 tablet (50 mg total) by mouth every 6 (six) hours as needed. Patient not taking: Reported on 02/14/2020 12/14/19   Janace Aris, NP    Family History Family History  Problem Relation Age of Onset  . Breast cancer Mother   . Hypertension Mother   . Congestive Heart Failure Father   . Hypertension Father     Social History Social History   Tobacco Use  . Smoking status: Never Smoker  . Smokeless tobacco: Never Used  Substance Use Topics  . Alcohol use: Yes    Comment: occ  . Drug use: No     Allergies   Patient has no known allergies.   Review of Systems Review of Systems  All other systems reviewed and are negative.    Physical Exam Triage Vital Signs ED Triage Vitals  Enc Vitals Group     BP 12/03/20 1137 (!) 196/100     Pulse Rate 12/03/20 1137 73     Resp 12/03/20 1137 18  Temp 12/03/20 1137 98.4 F (36.9 C)     Temp Source 12/03/20 1137 Oral     SpO2 12/03/20 1137 97 %     Weight --      Height --      Head Circumference --      Peak Flow --      Pain Score 12/03/20 1135 8     Pain Loc --      Pain Edu? --      Excl. in GC? --    No data found.  Updated Vital Signs BP (!) 196/100 (BP Location: Right Wrist)   Pulse 73   Temp 98.4 F (36.9 C) (Oral)   Resp 18   LMP 11/12/2020   SpO2 97%   Visual Acuity Right Eye Distance:   Left Eye Distance:   Bilateral Distance:    Right Eye Near:   Left Eye Near:    Bilateral Near:     Physical Exam Vitals and nursing note reviewed.  Constitutional:      General: She is not in acute distress.    Appearance: Normal appearance. She is not ill-appearing,  toxic-appearing or diaphoretic.  HENT:     Head: Normocephalic and atraumatic.  Eyes:     Conjunctiva/sclera: Conjunctivae normal.  Cardiovascular:     Rate and Rhythm: Normal rate.     Pulses: Normal pulses.  Pulmonary:     Effort: Pulmonary effort is normal.  Abdominal:     General: Abdomen is flat.  Musculoskeletal:        General: Normal range of motion.     Cervical back: Normal range of motion.  Skin:    General: Skin is warm and dry.     Findings: Abscess (see photo below) and erythema present.  Neurological:     General: No focal deficit present.     Mental Status: She is alert and oriented to person, place, and time.  Psychiatric:        Mood and Affect: Mood normal.        UC Treatments / Results  Labs (all labs ordered are listed, but only abnormal results are displayed) Labs Reviewed - No data to display  EKG   Radiology No results found.  Procedures Procedures (including critical care time)  Medications Ordered in UC Medications  cefTRIAXone (ROCEPHIN) injection 1 g (1 g Intramuscular Given 12/03/20 1230)    Initial Impression / Assessment and Plan / UC Course  I have reviewed the triage vital signs and the nursing notes.  Pertinent labs & imaging results that were available during my care of the patient were reviewed by me and considered in my medical decision making (see chart for details).     Abscess Cellulitis Rocephin 1g IM administered in office Wound aspiration negative  Bactrim BID x7 days Ibuprofen PRN for pain Warm compresses to area for comfort Follow up with the Emergency Department for worsening symptoms, including increased redness or swelling or development of fever  Final Clinical Impressions(s) / UC Diagnoses   Final diagnoses:  Abscess  Cellulitis of abdominal wall     Discharge Instructions     Take the Bactrim twice a day for the next 7 days.   You can take the Ibuprofen as needed for pain and swelling.   Apply a warm compress to the area for comfort.   If you develop a fever or notice the redness spreading beyond the purple line, go directly to the emergency department.  ED Prescriptions    Medication Sig Dispense Auth. Provider   sulfamethoxazole-trimethoprim (BACTRIM DS) 800-160 MG tablet Take 1 tablet by mouth 2 (two) times daily for 7 days. 14 tablet Chales Salmon R, NP   ibuprofen (ADVIL) 600 MG tablet Take 1 tablet (600 mg total) by mouth every 6 (six) hours as needed. 30 tablet Ivette Loyal, NP     PDMP not reviewed this encounter.   Ivette Loyal, NP 12/03/20 1246

## 2020-12-09 IMAGING — DX DG CHEST 2V
2 series · 2 of 2 positions shown · non-contrast
Comparison: None.

CLINICAL DATA: Chest pain.

EXAM:
CHEST - 2 VIEW

[chest pa]
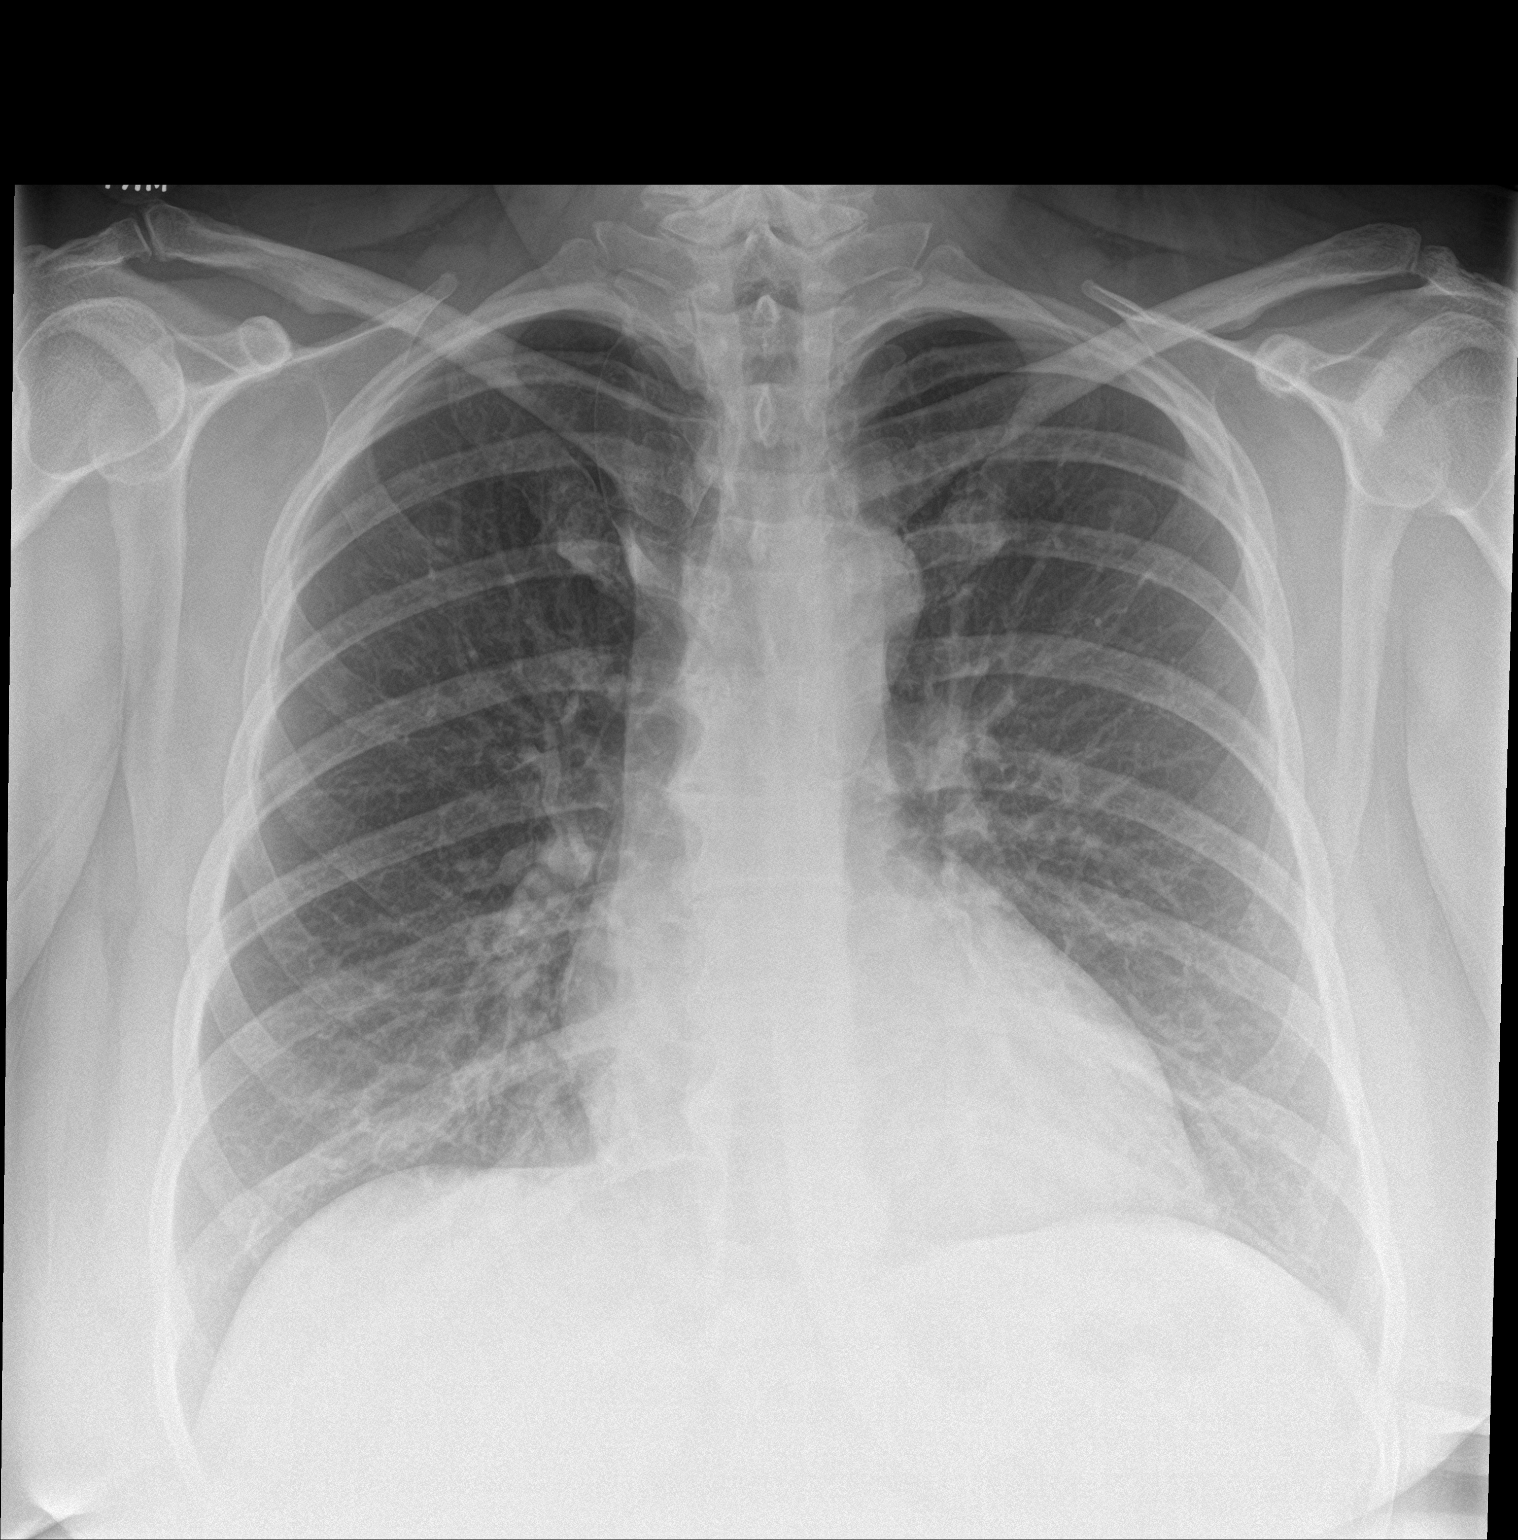

[chest lat]
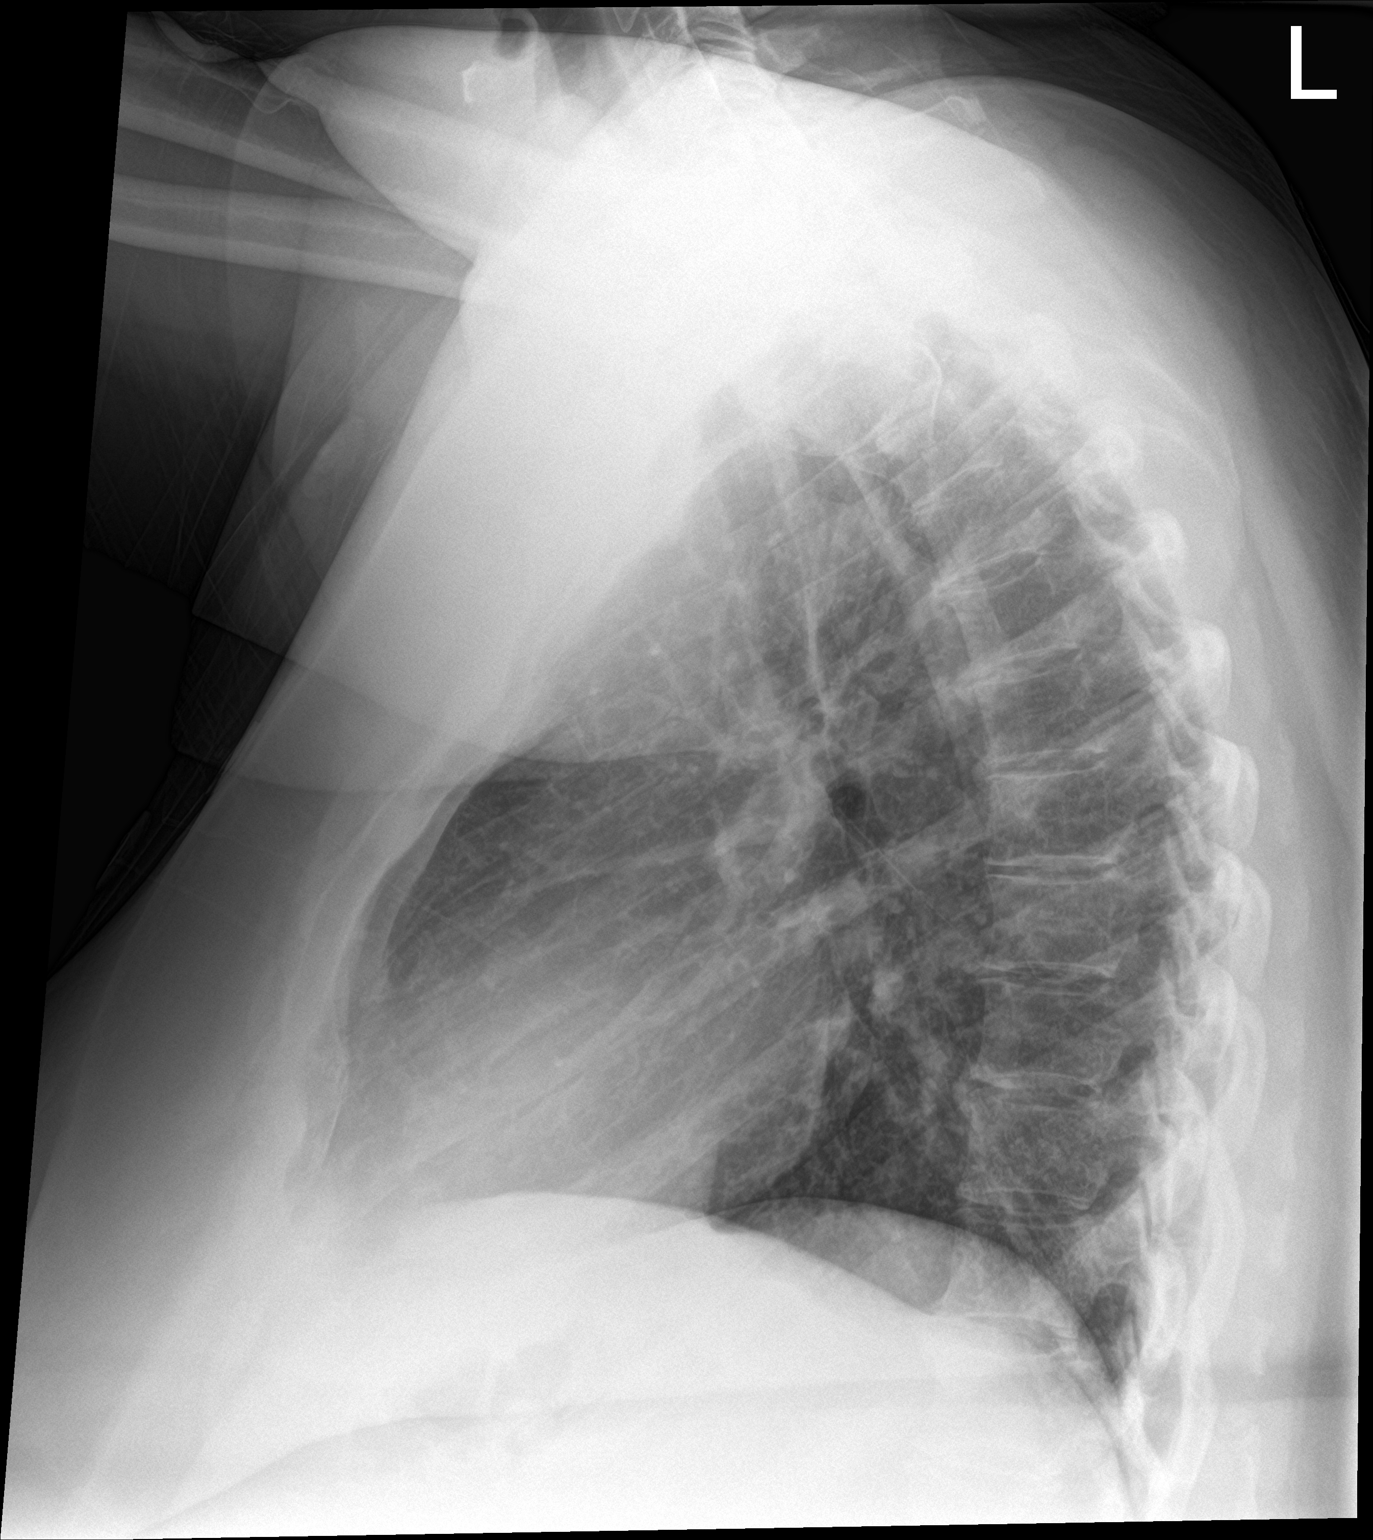

[2 of 2 positions shown; findings below may reference images not displayed]

FINDINGS: The heart size and mediastinal contours are within normal limits.
Both lungs are clear. The visualized skeletal structures are
unremarkable.
IMPRESSION: No active cardiopulmonary disease.

## 2020-12-24 ENCOUNTER — Telehealth: Payer: Self-pay | Admitting: Family Medicine

## 2020-12-24 NOTE — Telephone Encounter (Signed)
Patient would like to know her blood type, please advise

## 2020-12-24 NOTE — Telephone Encounter (Signed)
Call to patient- patient wants to know her blood type for informational purposes only. She states she has it on paperwork somewhere at home. Advised that PCP has never ordered it, advised reasons PCP would check it- not something that is normally done in yearly labs, [facility] will give card with blood type on it. Patient thankful for response- will keep looking for her paperwork at home.

## 2021-01-13 IMAGING — DX DG HAND COMPLETE 3+V*L*
3 series · 3 of 3 positions shown · non-contrast
Comparison: None.

CLINICAL DATA: Left hand pain, swelling and erythema since injury
at work last week. Open draining wound over the 5th digit.

EXAM:
LEFT HAND - COMPLETE 3+ VIEW

[hand pa]
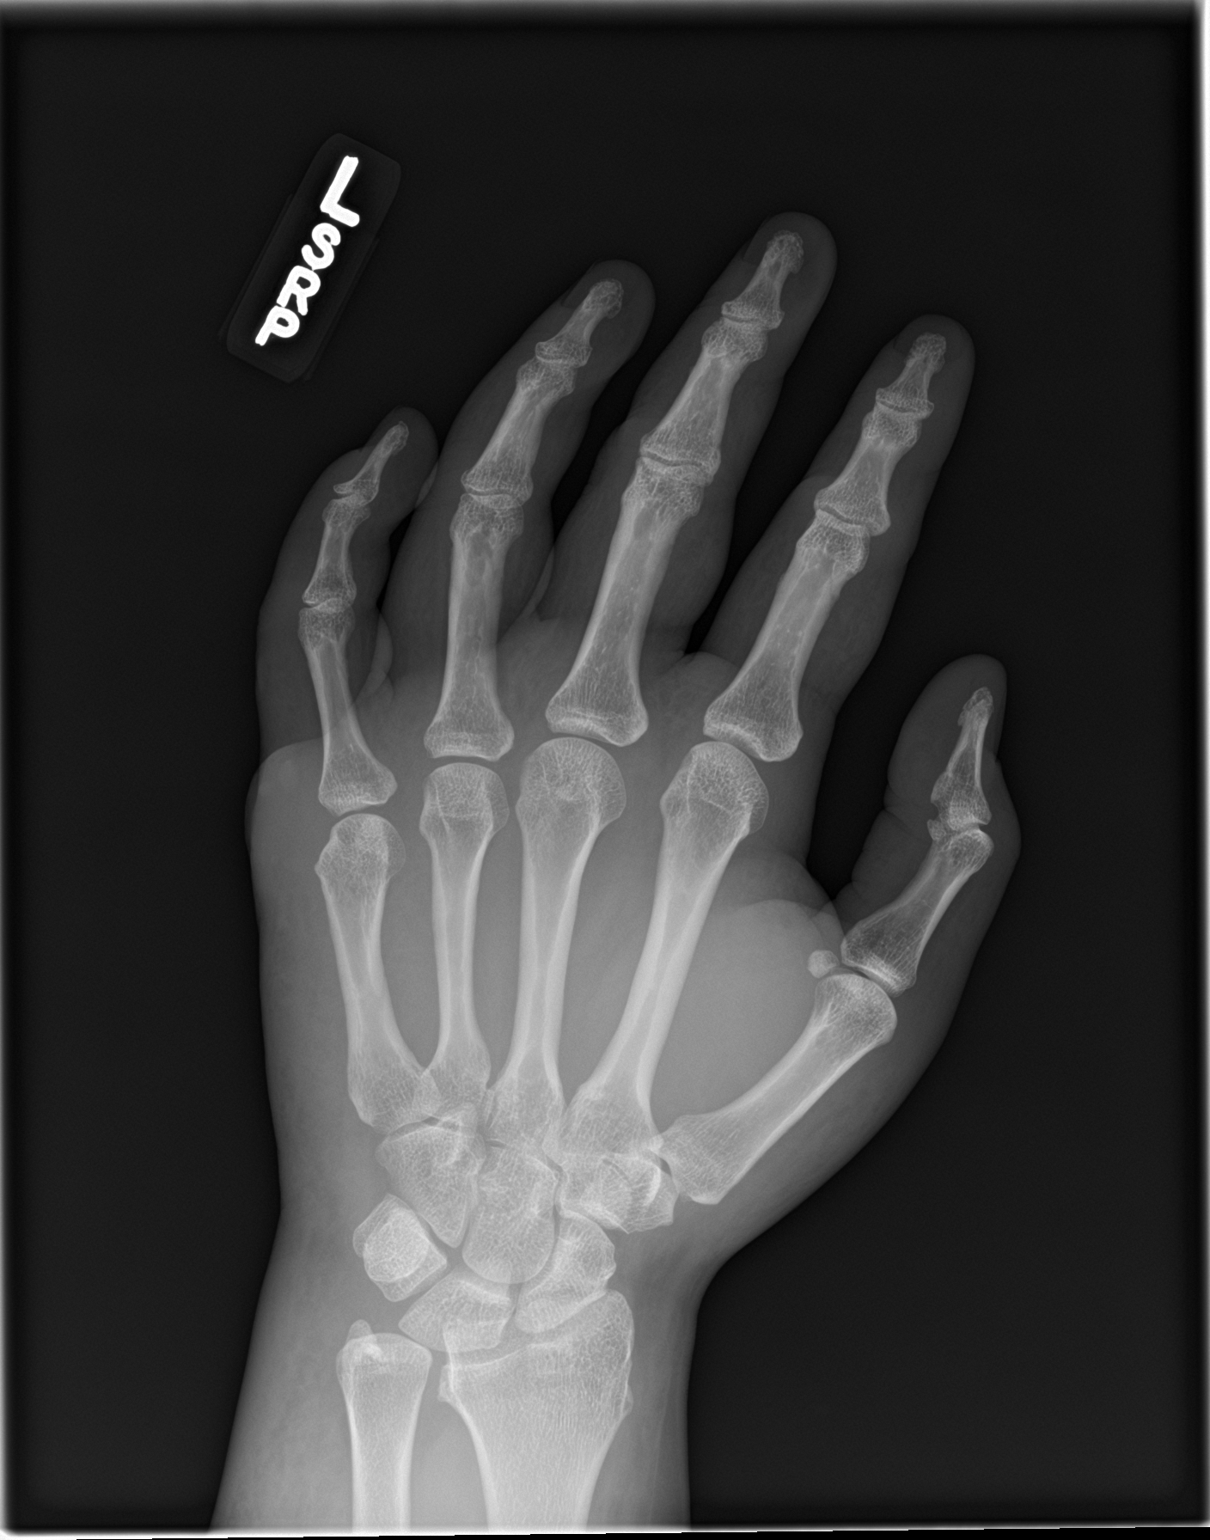

[hand obl]
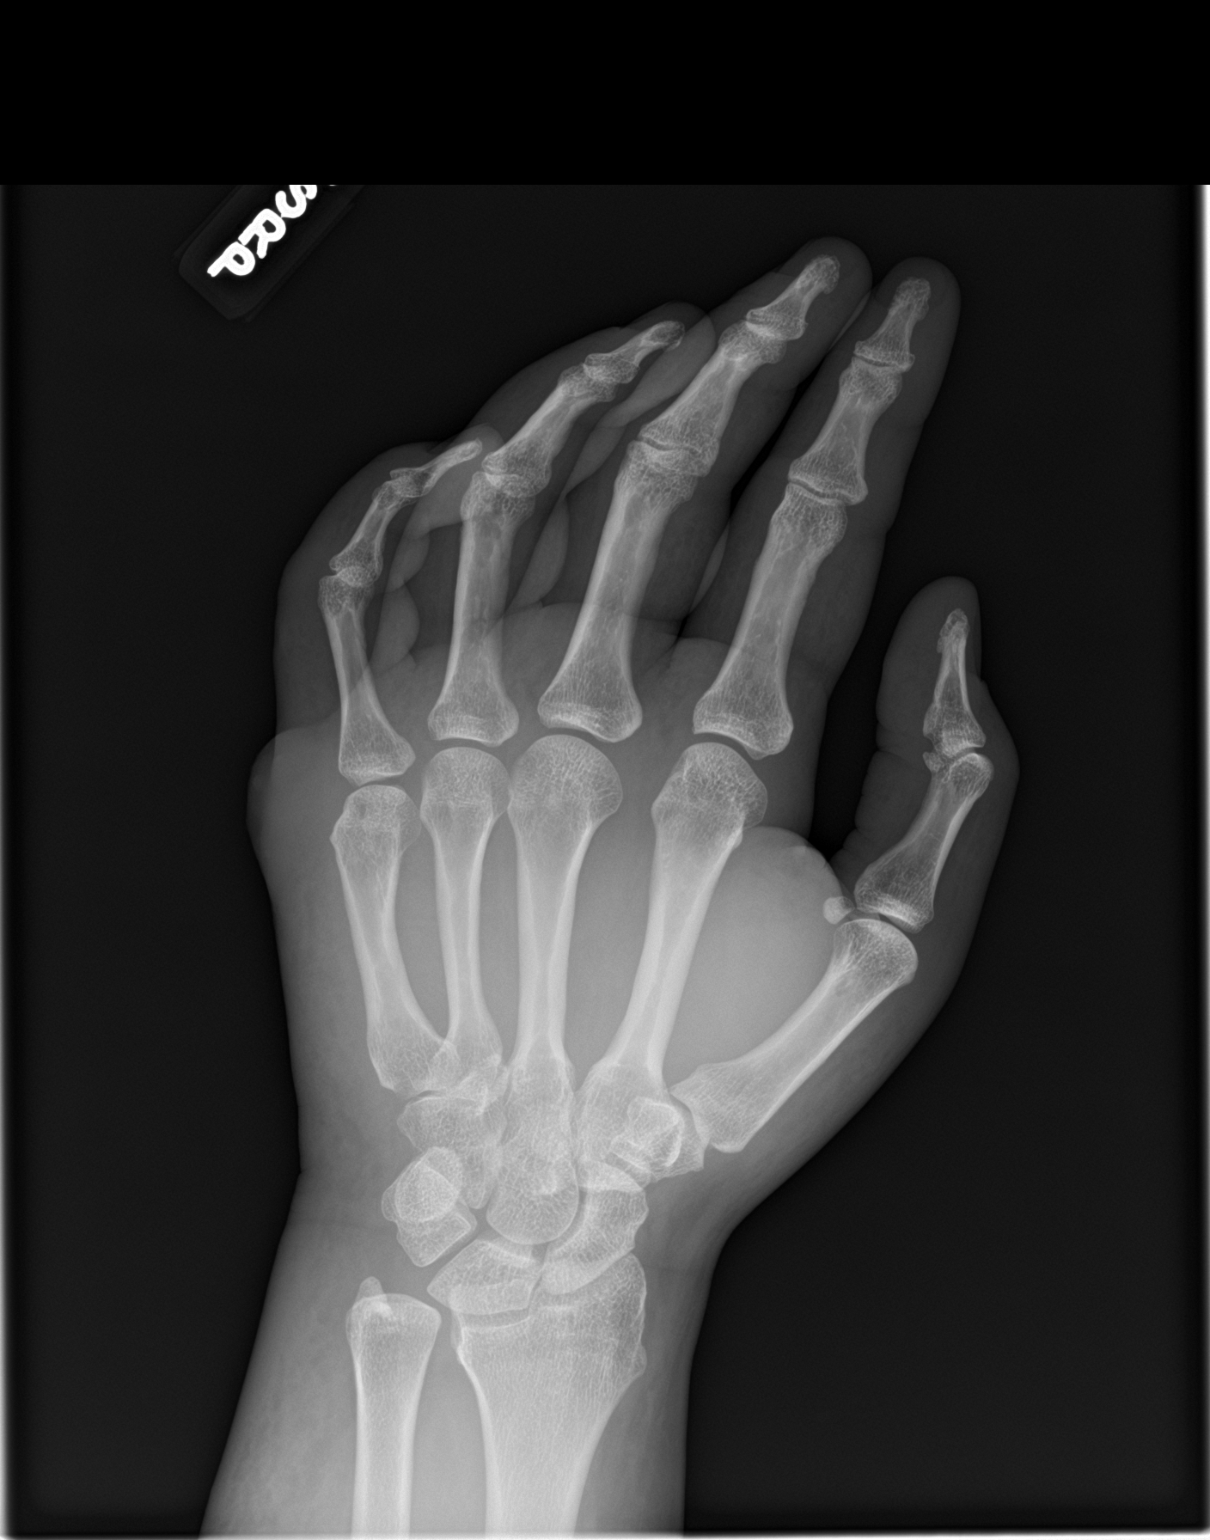

[hand lat]
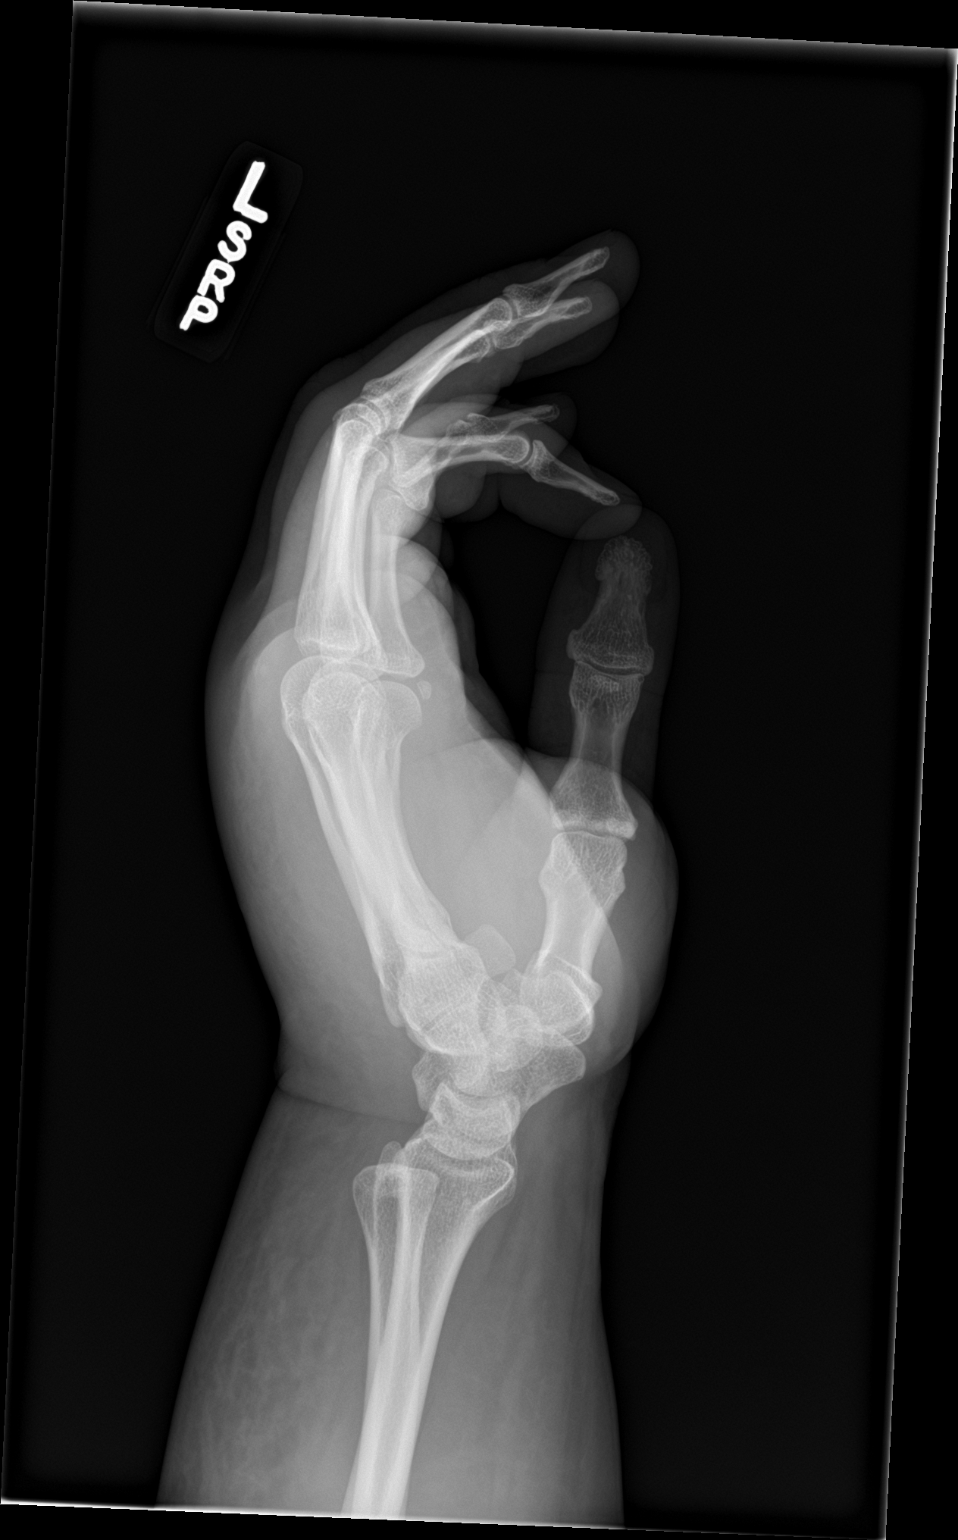

[3 of 3 positions shown; findings below may reference images not displayed]

FINDINGS: The mineralization and alignment are normal. There is no evidence of
acute fracture or dislocation. The joint spaces are preserved. No
evidence of bone destruction. There is diffuse soft tissue swelling
throughout the hand, especially dorsally. There is focal swelling
over the dorsal aspect of the 5th MCP joint. No foreign body or
definite soft tissue emphysema.
IMPRESSION: 1. Diffuse soft tissue swelling, especially adjacent to the 5th MCP
joint.
2. No radiopaque foreign body or evidence of osteomyelitis.

## 2023-06-14 ENCOUNTER — Ambulatory Visit
Admission: RE | Admit: 2023-06-14 | Discharge: 2023-06-14 | Disposition: A | Discharge status: Home / Self Care | Payer: Self-pay | Source: Ambulatory Visit | Attending: Internal Medicine | Admitting: Internal Medicine

## 2023-06-14 ENCOUNTER — Other Ambulatory Visit: Payer: Self-pay

## 2023-06-14 VITALS — BP 165/86 | HR 62 | Temp 98.1°F | Resp 18

## 2023-06-14 DIAGNOSIS — R030 Elevated blood-pressure reading, without diagnosis of hypertension: Secondary | ICD-10-CM

## 2023-06-14 DIAGNOSIS — R439 Unspecified disturbances of smell and taste: Secondary | ICD-10-CM

## 2023-06-14 DIAGNOSIS — R432 Parageusia: Secondary | ICD-10-CM

## 2023-06-14 NOTE — ED Triage Notes (Signed)
Pt reports loss of ability to smell some things and others smell like "ammonia" since June. Taste is also altered. Denies colds/illness.

## 2023-06-14 NOTE — Discharge Instructions (Addendum)
I have placed a referral to an ear, nose, throat doctor.  They should call you in the next few days, but if they do not, please call them at provided phone number.  Monitor blood pressure closely at home as well and follow-up with PCP if it remains elevated.

## 2023-06-14 NOTE — ED Provider Notes (Addendum)
[facility]    CSN: [id number] Arrival date & time: 06/14/23  1330      History   Chief Complaint Chief Complaint  Patient presents with   Altered sense of taste    HPI Mary Mathews is a 47 y.o. female.   Patient presents with altered sense of taste and smell.  Reports that her smell has been altered since sometime in June.  States that she can smell some things, but they smell like ammonia.  Her taste started to be altered approximately 2 weeks ago, and most things taste like ammonia as well.  She denies trauma to the nose or throat, any upper respiratory symptoms, sore throat, cough, any recent illness.  Patient's blood pressure is also elevated.  She states that she has a blood pressure medication at home that she takes as needed.  She is not reporting any chest pain, shortness of breath, dizziness, headache, nausea, vomiting.     Past Medical History:  Diagnosis Date   Hypertension     There are no problems to display for this patient.   Past Surgical History:  Procedure Laterality Date   APPENDECTOMY     CESAREAN SECTION      OB History   No obstetric history on file.      Home Medications    Prior to Admission medications   Medication Sig Start Date End Date Taking? Authorizing Provider  acetaminophen (TYLENOL) 325 MG tablet Take 650 mg by mouth every 6 (six) hours as needed for mild pain or fever.    [provider]  amLODipine (NORVASC) 5 MG tablet Take 1 tablet (5 mg total) by mouth daily. 03/28/20   Hoy Register, MD  amoxicillin-clavulanate (AUGMENTIN) 875-125 MG tablet Take 1 tablet by mouth every 12 (twelve) hours. Patient not taking: Reported on 02/14/2020 12/14/19   Dahlia Byes A, NP  ibuprofen (ADVIL) 600 MG tablet Take 1 tablet (600 mg total) by mouth every 6 (six) hours as needed. 12/03/20   Ivette Loyal, NP  meloxicam (MOBIC) 7.5 MG tablet Take 1 tablet (7.5 mg total) by mouth daily. Take in the morning, with food.  11/28/19   Hoy Register, MD  metroNIDAZOLE (FLAGYL) 500 MG tablet Take 1 tablet (500 mg total) by mouth 2 (two) times daily. Patient not taking: Reported on 03/28/2020 02/16/20   Hoy Register, MD  traMADol (ULTRAM) 50 MG tablet Take 1 tablet (50 mg total) by mouth every 6 (six) hours as needed. Patient not taking: Reported on 02/14/2020 12/14/19   Janace Aris, NP    Family History Family History  Problem Relation Age of Onset   Breast cancer Mother    Hypertension Mother    Congestive Heart Failure Father    Hypertension Father     Social History Social History   Tobacco Use   Smoking status: Never   Smokeless tobacco: Never  Vaping Use   Vaping status: Never Used  Substance Use Topics   Alcohol use: Yes    Comment: occ   Drug use: No     Allergies   Patient has no known allergies.   Review of Systems Review of Systems Per HPI  Physical Exam Triage Vital Signs ED Triage Vitals  Encounter Vitals Group     BP 06/14/23 1432 (!) 165/86     Systolic BP Percentile --      Diastolic BP Percentile --      Pulse Rate 06/14/23 1432 62     Resp  06/14/23 1432 18     Temp 06/14/23 1432 98.1 F (36.7 C)     Temp src --      SpO2 06/14/23 1432 98 %     Weight --      Height --      Head Circumference --      Peak Flow --      Pain Score 06/14/23 1430 0     Pain Loc --      Pain Education --      Exclude from Growth Chart --    No data found.  Updated Vital Signs BP (!) 165/86 (BP Location: Left Wrist)   Pulse 62   Temp 98.1 F (36.7 C)   Resp 18   LMP 05/22/2023   SpO2 98%   Visual Acuity Right Eye Distance:   Left Eye Distance:   Bilateral Distance:    Right Eye Near:   Left Eye Near:    Bilateral Near:     Physical Exam Constitutional:      General: She is not in acute distress.    Appearance: Normal appearance. She is not toxic-appearing or diaphoretic.  HENT:     Head: Normocephalic and atraumatic.     Nose: Nose normal.     Mouth/Throat:      Pharynx: Oropharynx is clear. No pharyngeal swelling.  Eyes:     Extraocular Movements: Extraocular movements intact.     Conjunctiva/sclera: Conjunctivae normal.  Pulmonary:     Effort: Pulmonary effort is normal.  Neurological:     General: No focal deficit present.     Mental Status: She is alert and oriented to person, place, and time. Mental status is at baseline.  Psychiatric:        Mood and Affect: Mood normal.        Behavior: Behavior normal.        Thought Content: Thought content normal.        Judgment: Judgment normal.      UC Treatments / Results  Labs (all labs ordered are listed, but only abnormal results are displayed) Labs Reviewed - No data to display  EKG   Radiology No results found.  Procedures Procedures (including critical care time)  Medications Ordered in UC Medications - No data to display  Initial Impression / Assessment and Plan / UC Course  I have reviewed the triage vital signs and the nursing notes.  Pertinent labs & imaging results that were available during my care of the patient were reviewed by me and considered in my medical decision making (see chart for details).     Physical exam is benign with no obvious abnormalities to the nose or throat.  Will defer covid testing given duration of time since symptoms started. Discussed with patient that she will need to see ENT specialty for further evaluation and management of altered taste and smell sensation.  Ambulatory referral to ENT specialty was placed.  Advised patient that if they do not call her over the next few days, she is to call them herself at provided phone number.  Blood pressure also slightly elevated today.  Encouraged her to monitor blood pressure closely at home and follow-up with PCP or urgent care if it remains elevated.  Patient verbalized understanding and was agreeable with plan. Final Clinical Impressions(s) / UC Diagnoses   Final diagnoses:  Altered taste   Sense of smell altered  Elevated blood pressure reading     Discharge Instructions  I have placed a referral to an ear, nose, throat doctor.  They should call you in the next few days, but if they do not, please call them at provided phone number.  Monitor blood pressure closely at home as well and follow-up with PCP if it remains elevated.    ED Prescriptions   None    PDMP not reviewed this encounter.   Gustavus Bryant, [place] 06/14/23 1447    Gustavus Bryant, [place] 06/14/23 1447

## 2024-04-18 ENCOUNTER — Encounter: Payer: Self-pay | Admitting: Family Medicine

## 2024-04-18 ENCOUNTER — Encounter: Payer: Self-pay | Admitting: Internal Medicine

## 2024-04-28 ENCOUNTER — Ambulatory Visit: Payer: Self-pay | Admitting: Internal Medicine
